# Patient Record
Sex: Male | Born: 1955 | Race: White | Hispanic: No | Marital: Single | Smoking: Former smoker
Health system: Southern US, Community
[De-identification: ages and names within clinical notes are randomized; demographics above are authoritative.]

## PROBLEM LIST (undated history)

## (undated) DIAGNOSIS — I10 Essential (primary) hypertension: Secondary | ICD-10-CM

## (undated) DIAGNOSIS — F32A Depression, unspecified: Secondary | ICD-10-CM

## (undated) DIAGNOSIS — F329 Major depressive disorder, single episode, unspecified: Secondary | ICD-10-CM

## (undated) DIAGNOSIS — K222 Esophageal obstruction: Secondary | ICD-10-CM

## (undated) DIAGNOSIS — K219 Gastro-esophageal reflux disease without esophagitis: Secondary | ICD-10-CM

## (undated) DIAGNOSIS — K449 Diaphragmatic hernia without obstruction or gangrene: Secondary | ICD-10-CM

## (undated) HISTORY — DX: Major depressive disorder, single episode, unspecified: F32.9

## (undated) HISTORY — DX: Gastro-esophageal reflux disease without esophagitis: K21.9

## (undated) HISTORY — PX: OTHER SURGICAL HISTORY: SHX169

## (undated) HISTORY — DX: Depression, unspecified: F32.A

---

## 2012-06-26 ENCOUNTER — Encounter (HOSPITAL_COMMUNITY): Payer: Self-pay | Admitting: Emergency Medicine

## 2012-06-26 ENCOUNTER — Emergency Department (HOSPITAL_COMMUNITY): Payer: 59

## 2012-06-26 ENCOUNTER — Emergency Department (HOSPITAL_COMMUNITY)
Admission: EM | Admit: 2012-06-26 | Discharge: 2012-06-28 | Disposition: A | Discharge status: Home / Self Care | Payer: 59 | Attending: Emergency Medicine | Admitting: Emergency Medicine

## 2012-06-26 DIAGNOSIS — Z79899 Other long term (current) drug therapy: Secondary | ICD-10-CM | POA: Insufficient documentation

## 2012-06-26 DIAGNOSIS — R29818 Other symptoms and signs involving the nervous system: Secondary | ICD-10-CM | POA: Insufficient documentation

## 2012-06-26 DIAGNOSIS — R4182 Altered mental status, unspecified: Secondary | ICD-10-CM | POA: Insufficient documentation

## 2012-06-26 DIAGNOSIS — F061 Catatonic disorder due to known physiological condition: Secondary | ICD-10-CM

## 2012-06-26 DIAGNOSIS — F3289 Other specified depressive episodes: Secondary | ICD-10-CM | POA: Insufficient documentation

## 2012-06-26 DIAGNOSIS — F329 Major depressive disorder, single episode, unspecified: Secondary | ICD-10-CM | POA: Insufficient documentation

## 2012-06-26 HISTORY — DX: Diaphragmatic hernia without obstruction or gangrene: K44.9

## 2012-06-26 HISTORY — DX: Esophageal obstruction: K22.2

## 2012-06-26 LAB — URINALYSIS, ROUTINE W REFLEX MICROSCOPIC
Bilirubin Urine: NEGATIVE
Glucose, UA: NEGATIVE mg/dL
Hgb urine dipstick: NEGATIVE
Ketones, ur: NEGATIVE mg/dL
Leukocytes, UA: NEGATIVE
Nitrite: NEGATIVE
Protein, ur: NEGATIVE mg/dL
Specific Gravity, Urine: 1.013 (ref 1.005–1.030)
Urobilinogen, UA: 1 mg/dL (ref 0.0–1.0)
pH: 7 (ref 5.0–8.0)

## 2012-06-26 LAB — BASIC METABOLIC PANEL
BUN: 6 mg/dL (ref 6–23)
CO2: 29 mEq/L (ref 19–32)
Calcium: 9.3 mg/dL (ref 8.4–10.5)
Chloride: 99 mEq/L (ref 96–112)
Creatinine, Ser: 0.53 mg/dL (ref 0.50–1.35)
GFR calc Af Amer: >90 mL/min (ref >90)
GFR calc non Af Amer: >90 mL/min (ref >90)
Glucose, Bld: 123 mg/dL — ABNORMAL HIGH (ref 70–99)
Potassium: 3.5 mEq/L (ref 3.5–5.1)
Sodium: 136 mEq/L (ref 135–145)

## 2012-06-26 LAB — RAPID URINE DRUG SCREEN, HOSP PERFORMED
Amphetamines: NOT DETECTED
Barbiturates: NOT DETECTED
Benzodiazepines: NOT DETECTED
Cocaine: NOT DETECTED
Opiates: NOT DETECTED
Tetrahydrocannabinol: NOT DETECTED

## 2012-06-26 LAB — CBC
HCT: 39.6 % (ref 39.0–52.0)
Hemoglobin: 14.1 g/dL (ref 13.0–17.0)
MCH: 31.7 pg (ref 26.0–34.0)
MCHC: 35.6 g/dL (ref 30.0–36.0)
MCV: 89 fL (ref 78.0–100.0)
Platelets: 206 10*3/uL (ref 150–400)
RBC: 4.45 MIL/uL (ref 4.22–5.81)
RDW: 12.1 % (ref 11.5–15.5)
WBC: 7.5 10*3/uL (ref 4.0–10.5)

## 2012-06-26 LAB — MAGNESIUM: Magnesium: 1.9 mg/dL (ref 1.5–2.5)

## 2012-06-26 LAB — ETHANOL: Alcohol, Ethyl (B): <11 mg/dL (ref 0–11)

## 2012-06-26 NOTE — ED Notes (Signed)
Family at bedside. Awaiting tele-psych consulting MD to call back. Will remain at bedside until evaluation is complete.

## 2012-06-26 NOTE — ED Notes (Signed)
MD at bedside. 

## 2012-06-26 NOTE — ED Provider Notes (Signed)
History    56 year old male brought in by daughter and sister because of decreased mental status and change in behavior. Patient was just discharged from [facility] today for the same. He initially presented there on October 3 for change in his behavior. Patient seems confused and slow to respond to questioning. Voice is extremely soft and most of his speech is difficult to understand because of this. Family first noticed these changes a week from this past Friday. In the emergency room there he had a CT scan which should a question of a frontal stroke and the patient was admitted. Subsequent MRI was performed did not show any acute abnormalities. Further evaluation including an echocardiogram and carotid Dopplers were unremarkable. It does not appear that he was evaluated by neurology or psychiatry. Per his family patient has not significantly improved which is why they brought him to the emergency room again today. No diagnosed psych hx that they are aware of. No drug or etoh abuse that they know of. Despite his behavior pt has been able to take care of ADLs. Pt with no significant PMHx aside from hiatal hernia, but per family does not regularly go to doctor. 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  CSN: [id number]  Arrival date & time 06/26/12  1903   First MD Initiated Contact with Patient 06/26/12 1927      Chief Complaint  Patient presents with  . Altered Mental Status  . Aphasia    (Consider location/radiation/quality/duration/timing/severity/associated sxs/prior treatment) HPI  Past Medical History  Diagnosis Date  . Esophageal stricture   . Hiatal hernia     History reviewed. No pertinent past surgical history.  No family history on file.  History  Substance Use Topics  . Smoking status: Former Smoker    Quit date: 04/26/2012  . Smokeless tobacco: Not on file  . Alcohol Use: No      Review of Systems  Level 5 caveat applies because of  altered mental status.   Allergies  Review of patient's allergies indicates not on file.  Home Medications  No current outpatient prescriptions on file.  BP 136/73  Pulse 86  Temp 99.1 F (37.3 C) (Oral)  Resp 23  SpO2 98%  Physical Exam  Nursing note and vitals reviewed. Constitutional: He appears well-developed and well-nourished. No distress.       Laying in bed. NAD. Reasonably well kept.  HENT:  Head: Normocephalic and atraumatic.  Eyes: Conjunctivae normal are normal. Pupils are equal, round, and reactive to light. Right eye exhibits no discharge. Left eye exhibits no discharge.       No nysgtamus  Neck: Neck supple.       No nuchal rigidity  Cardiovascular: Normal rate, regular rhythm and normal heart sounds.  Exam reveals no gallop and no friction rub.   No murmur heard. Pulmonary/Chest: Effort normal and breath sounds normal. No respiratory distress.  Abdominal: Soft. He exhibits no distension. There is no tenderness.  Musculoskeletal: He exhibits no edema and no tenderness.  Neurological: He is alert.       Speech slow and hesitant. Speaks in a whisper and most of what he says is not loud enough to understand. Follows basic commands. CN2-12 intact. Strength. 5/5 b/l UE. 4/5 b/l LEs. Would not/could not follow commands for finger to nose. Biceps and patellar reflexes present but seem diminished.   Skin: Skin is warm and dry. He is not diaphoretic.       No concerning rash  noted.  Psychiatric:       Flat affect. Almost catatonic.    ED Course  Procedures (including critical care time)  Labs Reviewed  BASIC METABOLIC PANEL - Abnormal; Notable for the following:    Glucose, Bld 123 (*)     All other components within normal limits  CBC  URINALYSIS, ROUTINE W REFLEX MICROSCOPIC  ETHANOL  URINE RAPID DRUG SCREEN (HOSP PERFORMED)  MAGNESIUM   No results found.  EKG:  Rhythm: Normal sinus Rate: 90 Axis: Normal Intervals: Left atrial enlargement ST segments:  Nonspecific ST changes inferiorly. There is some T wave flattening. Comparison: None  1. Catatonic state       MDM  56yM with AMS. Fairly extensive medical w/u just a couple days ago. Question utility of another medical admission at this time. I have a suspicion that this is actually psychogenic in nature. May be related to death in family. Will obtain psychiatric consultation.   12:18 AM Psychiatry recommendations were reviewed. Recommending inpatient admission. Will discuss with ACT.     Raeford Razor, MD 06/27/12 [phone]

## 2012-06-26 NOTE — ED Notes (Signed)
Tele-psych consult suspended temporarily. Awaiting family at bedside to participate in consult.

## 2012-06-26 NOTE — ED Notes (Signed)
Bed:WA19<BR> Expected date:<BR> Expected time:<BR> Means of arrival:<BR> Comments:<BR> Triage 2

## 2012-06-26 NOTE — ED Notes (Signed)
Family reports recent death of patient mother, approx 1-2 weeks ago. Patient became more withdrawn following the loss.

## 2012-06-26 NOTE — ED Notes (Signed)
Sept 27th- c/o being tired, ran out of gas-- daughter met him and said that he wasn't acting right. Speech slurred then, but patient said he was tired. Went home and slept, felt better. This past Thursday went to [facility] ED with slurred speech, got admitted, d/c'd tonight. Family told that he hasn 't had a stroke, upon arrival to ED, nonverbal, but will answer questions with prompting. S[eech clear- quiet,

## 2012-06-26 NOTE — ED Notes (Signed)
TelePsych consult completed 

## 2012-06-27 MED ORDER — ACETAMINOPHEN 325 MG PO TABS: 650.0000 mg | ORAL_TABLET | ORAL | Status: DC | PRN

## 2012-06-27 MED ORDER — ATORVASTATIN CALCIUM 40 MG PO TABS
40.0000 mg | ORAL_TABLET | Freq: Every day | ORAL | Status: DC
  Administered 2012-06-27: 40 mg via ORAL
  Filled 2012-06-27 (×2): qty 1

## 2012-06-27 MED ORDER — ASPIRIN EC 325 MG PO TBEC
325.0000 mg | DELAYED_RELEASE_TABLET | Freq: Every day | ORAL | Status: DC
  Administered 2012-06-27 – 2012-06-28 (×2): 325 mg via ORAL
  Filled 2012-06-27 (×2): qty 1

## 2012-06-27 MED ORDER — LORAZEPAM 1 MG PO TABS: 1.0000 mg | ORAL_TABLET | Freq: Three times a day (TID) | ORAL | Status: DC | PRN

## 2012-06-27 MED ORDER — ONDANSETRON HCL 4 MG PO TABS: 4.0000 mg | ORAL_TABLET | Freq: Three times a day (TID) | ORAL | Status: DC | PRN

## 2012-06-27 MED ORDER — LISINOPRIL 10 MG PO TABS
10.0000 mg | ORAL_TABLET | Freq: Every day | ORAL | Status: DC
  Administered 2012-06-27: 10 mg via ORAL
  Filled 2012-06-27 (×2): qty 1

## 2012-06-27 MED ORDER — ZOLPIDEM TARTRATE 5 MG PO TABS: 5.0000 mg | ORAL_TABLET | Freq: Every evening | ORAL | Status: DC | PRN

## 2012-06-27 MED ORDER — IBUPROFEN 200 MG PO TABS: 600.0000 mg | ORAL_TABLET | Freq: Three times a day (TID) | ORAL | Status: DC | PRN

## 2012-06-27 NOTE — ED Notes (Signed)
Pt given something to drink.  °

## 2012-06-27 NOTE — Progress Notes (Signed)
Pt confirms he does not have a pcp Provided him a list of [place] self pay providers along with medication and financial assistance resources

## 2012-06-27 NOTE — ED Notes (Signed)
Pt sitting quietly in bed. NAD noted at this time. Pt assisted with the urinal.

## 2012-06-27 NOTE — Progress Notes (Signed)
CSW received call from pt RN, regarding placement for patient. Per chart review, and discussion with ACT, pt is on the ACT consult list to be assessed. CSW will updated pt RN. Please reconsult csw after ACT has assessed patient.   Catha Gosselin, LCSWA  [phone] 06/27/2012 1816pm

## 2012-06-27 NOTE — ED Provider Notes (Signed)
No new c/o  Donnetta Hutching, MD 06/27/12 [phone]

## 2012-06-27 NOTE — BH Assessment (Signed)
Assessment Note   Mark Ho is an 56 y.o. male who was brought to [facility] by his sister and daughter who are present for assessment. Pt was d/c from another ED earlier today and family says that pt needs further diagnostic testing. Pt appears confused and barely speaks. He holds cup of coffee in one hand but doesn't drink it or move arm. Catatonic-like symptoms present. Pt oriented to self and date. When asked how [place] president is, he mumbles incoherently and then says, "Fraser Din". Pt is quiet remainder of assessment and doesn't speak.  Sister Britta Mccreedy provides collateral info. She says pt was "acting normal" until 2 wks ago when he ran out of gas. She says he didn't know where to pour gas from gas can until daughter showed him. Britta Mccreedy sts that pt has seemed scared and confused for past 2 weeks. He barely speaks and stares at the wall or TV most of the time. Pt worked as a Tour manager until recently. Pt has no hx of mental illness and never had MH treatment.  Telepsych initiated by EDP rec inpatient hospitalization. Dr Jacky Kindle of telepsych consult sts, "Based upon present presentation, I would repeat medical and neurological workups including a spinal tap. Once medically/neurologically cleared pt can be transferred to a psych facility".  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 Esophageal stricture   . Hiatal hernia    Axis IV: Deferred Axis V: 21-30 behavior considerably influenced by delusions or hallucinations OR serious impairment in judgment, communication OR inability to function in almost all areas  Past Medical History:  Past Medical History  Diagnosis Date  . Esophageal stricture   . Hiatal hernia     History reviewed. No pertinent past surgical history.  Family History: No family history on file.  Social History:  reports that he quit smoking about 2 months ago. He does not have any smokeless tobacco history on  file. He reports that he does not drink alcohol or use illicit drugs.  Additional Social History:  Alcohol / Drug Use Pain Medications: see PTA meds list Prescriptions: see PTA meds list Over the Counter: see PTA meds list History of alcohol / drug use?: No history of alcohol / drug abuse Longest period of sobriety (when/how long): na  CIWA: CIWA-Ar BP: 134/78 mmHg Pulse Rate: 64  COWS:    Allergies: Not on File  Home Medications:  (Not in a hospital admission)  OB/GYN Status:  No LMP for male patient.  General Assessment Data Location of Assessment: [facility] ED Living Arrangements: Spouse/significant other Can pt return to current living arrangement?: Yes Admission Status: Voluntary Is patient capable of signing voluntary admission?: No Transfer from: Home Referral Source: Self/Family/Friend  Education Status Is patient currently in school?: No Current Grade: na Highest grade of school patient has completed: 8  Risk to self Suicidal Ideation:  (unable to assess) Suicidal Intent:  (unable to assess) Is patient at risk for suicide?: No Suicidal Plan?:  (unable to assess) Access to Means:  (unable to assess) What has been your use of drugs/alcohol within the last 12 months?:  (unable to assess - UDS and BAL are negative) Previous Attempts/Gestures:  (unable to assess) How many times?:  (unable to assess) Other Self Harm Risks:  (unable to assess) Triggers for Past Attempts:  (na) Intentional Self Injurious Behavior: None Family Suicide History: No Recent stressful life event(s):  (unable to assess) Persecutory voices/beliefs?:  (unable to  assess) Depression:  (unable to assess) Depression Symptoms:  (unable to assess) Substance abuse history and/or treatment for substance abuse?:  (unable to assess) Suicide prevention information given to non-admitted patients: Not applicable  Risk to Others Homicidal Ideation:  (unable to assess) Thoughts of Harm to Others:  (unable  to assess) Current Homicidal Intent:  (unable to assess) Current Homicidal Plan:  (unable to assess) Access to Homicidal Means:  (unable to assess) Identified Victim:  (unable to assess) History of harm to others?:  (unable to assess) Assessment of Violence: None Noted Violent Behavior Description:  (unable to assess) Does patient have access to weapons?:  (unable to assess) Criminal Charges Pending?:  (sister sts no charges pending) Does patient have a court date:  (sister sts pt has no court date)  Psychosis Hallucinations:  (unable to assess) Delusions:  (unable to assess)  Mental Status Report Eye Contact: Poor Motor Activity: Rigidity;Psychomotor retardation Speech: Soft;Incoherent;Other (Comment) (mumbling) Level of Consciousness: Quiet/awake Mood:  (unable to assess) Affect: Other (Comment) (confused) Anxiety Level:  (unable to assess) Thought Processes:  (unable to assess as pt barely speaking) Judgement:  (unable to assess) Orientation: Person;Time Obsessive Compulsive Thoughts/Behaviors:  (unable to assess)  Cognitive Functioning Concentration:  (unable to assess) Memory:  (unable to assess) IQ:  (unable to assess) Insight:  (unable to assess) Impulse Control:  (unable to assess) Appetite:  (unable to assess) Weight Loss: 0  Weight Gain: 0   ADLScreening [facility]) Patient's cognitive ability adequate to safely complete daily activities?: No Patient able to express need for assistance with ADLs?: No Independently performs ADLs?: Yes (appropriate for developmental age)  Abuse/Neglect [facility]) Physical Abuse:  (unable to assess) Verbal Abuse:  (unable to assess) Sexual Abuse:  (unable to assess)  Prior Inpatient Therapy Prior Inpatient Therapy: No (sister sts no hx of inpt therapy) Prior Therapy Dates: na Prior Therapy Facilty/Provider(s): na Reason for Treatment: na  Prior Outpatient Therapy Prior Outpatient Therapy: No (sister sts pt has  no hx of outpt tx) Prior Therapy Dates: na Prior Therapy Facilty/Provider(s): na Reason for Treatment: na  ADL Screening (condition at time of admission) Patient's cognitive ability adequate to safely complete daily activities?: No Patient able to express need for assistance with ADLs?: No Independently performs ADLs?: Yes (appropriate for developmental age) Weakness of Legs: None Weakness of Arms/Hands: None  Home Assistive Devices/Equipment Home Assistive Devices/Equipment: None    Abuse/Neglect Assessment (Assessment to be complete while patient is alone) Physical Abuse:  (unable to assess) Verbal Abuse:  (unable to assess) Sexual Abuse:  (unable to assess) Exploitation of patient/patient's resources:  (unable to assess) Values / Beliefs Cultural Requests During Hospitalization: None Spiritual Requests During Hospitalization: None   Advance Directives (For Healthcare) Advance Directive: Patient does not have advance directive;Patient would not like information    Additional Information 1:1 In Past 12 Months?: No CIRT Risk: No Elopement Risk: No Does patient have medical clearance?: Yes     Disposition:  Disposition Disposition of Patient: Inpatient treatment program  On Site Evaluation by:   Reviewed with Physician:     Donnamarie Rossetti P 06/27/2012 3:04 AM

## 2012-06-27 NOTE — ED Notes (Signed)
Pt given coffee. °

## 2012-06-27 NOTE — ED Notes (Signed)
Pt was able to eat a small amount of his breakfast this morning. Pt is verbal, speech is soft and mumbled. NAD at this time.

## 2012-06-28 ENCOUNTER — Emergency Department (HOSPITAL_COMMUNITY): Payer: 59

## 2012-06-28 DIAGNOSIS — F39 Unspecified mood [affective] disorder: Secondary | ICD-10-CM

## 2012-06-28 LAB — CBC WITH DIFFERENTIAL/PLATELET
Basophils Absolute: 0 10*3/uL (ref 0.0–0.1)
Basophils Relative: 0 % (ref 0–1)
Eosinophils Absolute: 0.1 10*3/uL (ref 0.0–0.7)
MCH: 32.2 pg (ref 26.0–34.0)
MCHC: 35.9 g/dL (ref 30.0–36.0)
Neutrophils Relative %: 62 % (ref 43–77)
Platelets: 199 10*3/uL (ref 150–400)
RDW: 12.3 % (ref 11.5–15.5)

## 2012-06-28 LAB — POCT I-STAT, CHEM 8
BUN: 19 mg/dL (ref 6–23)
Calcium, Ion: 1.22 mmol/L (ref 1.12–1.23)
Chloride: 96 mEq/L (ref 96–112)
Creatinine, Ser: 0.8 mg/dL (ref 0.50–1.35)
Glucose, Bld: 104 mg/dL — ABNORMAL HIGH (ref 70–99)
HCT: 43 % (ref 39.0–52.0)
Hemoglobin: 14.6 g/dL (ref 13.0–17.0)
Potassium: 3.7 mEq/L (ref 3.5–5.1)
Sodium: 134 mEq/L — ABNORMAL LOW (ref 135–145)
TCO2: 26 mmol/L (ref 0–100)

## 2012-06-28 MED ORDER — ESCITALOPRAM OXALATE 10 MG PO TABS: 10.0000 mg | ORAL_TABLET | Freq: Every day | ORAL | Status: DC

## 2012-06-28 MED ORDER — CLONAZEPAM 0.5 MG PO TABS: 0.2500 mg | ORAL_TABLET | Freq: Two times a day (BID) | ORAL | Status: DC | PRN

## 2012-06-28 NOTE — ED Notes (Signed)
Pt dressed in his regular clothes, pt is being discharge.

## 2012-06-28 NOTE — ED Provider Notes (Addendum)
History     CSN: [id number]  Arrival date & time 06/26/12  1903   First MD Initiated Contact with Patient 06/26/12 1927      Chief Complaint  Patient presents with  . Altered Mental Status  . Aphasia    (Consider location/radiation/quality/duration/timing/severity/associated sxs/prior treatment) HPI  Past Medical History  Diagnosis Date  . Esophageal stricture   . Hiatal hernia     History reviewed. No pertinent past surgical history.  No family history on file.  History  Substance Use Topics  . Smoking status: Former Smoker    Quit date: 04/26/2012  . Smokeless tobacco: Not on file  . Alcohol Use: No      Review of Systems  Allergies  Review of patient's allergies indicates not on file.  Home Medications   Current Outpatient Rx  Name Route Sig Dispense Refill  . ASPIRIN 325 MG PO TBEC Oral Take 325 mg by mouth daily.    . ATORVASTATIN CALCIUM 40 MG PO TABS Oral Take 40 mg by mouth daily.    Marland Kitchen LISINOPRIL 10 MG PO TABS Oral Take 10 mg by mouth daily.      BP 90/56  Pulse 74  Temp 97.6 F (36.4 C) (Oral)  Resp 16  Ht 5\' 8"  (1.727 m)  Wt 127 lb (57.607 kg)  BMI 19.31 kg/m2  SpO2 99%  Physical Exam  ED Course  Procedures (including critical care time)  Labs Reviewed  BASIC METABOLIC PANEL - Abnormal; Notable for the following:    Glucose, Bld 123 (*)     All other components within normal limits  CBC  URINALYSIS, ROUTINE W REFLEX MICROSCOPIC  ETHANOL  URINE RAPID DRUG SCREEN (HOSP PERFORMED)  MAGNESIUM  CBC WITH DIFFERENTIAL   Ct Head Wo Contrast  06/26/2012  *RADIOLOGY REPORT*  Clinical Data: Altered mental status.  CT HEAD WITHOUT CONTRAST  Technique:  Contiguous axial images were obtained from the base of the skull through the vertex without contrast.  Comparison: MRI 06/24/2012  Findings: No acute intracranial abnormality.  Specifically, no hemorrhage, hydrocephalus, mass lesion, acute infarction, or significant intracranial injury.  No  acute calvarial abnormality. Visualized paranasal sinuses and mastoids clear.  Orbital soft tissues unremarkable.  IMPRESSION: No acute intracranial abnormality.   Original Report Authenticated By: Cyndie Chime, M.D.      1. Catatonic state       MDM   Patient was initially seen for altered mental status which proved nursing note the family states that he had slurred speech. Patient recently had a hospitalization with a complete stroke workup at [facility] that was all negative including an MRI showing no acute abnormalities.  Patient's complaint daughter was that he was tired and she felt that he was not acting right. Initially upon arrival to the emergency room patient was nonverbal and would only answer questions with prompting however at that time his speech is clear but quiet. Telepsych saw the patient yesterday and thought that he could benefit from psychiatric hospitalization however first they felt he needed a repeat medical or neurologic exam with a possible LP. Reason I feel the patient would need a lumbar puncture would be to rule out meningitis.  Patient displays no signs of meningitis on exam. He is able to completely range his neck and denies neck or head pain. He is able to answer questions appropriately on exam with no slurred speech or signs of altered mental status at this time.  Will repeat CBC and i-stat and  CXR to ensure no new issues.  However feel he is medically stable.    Date: 06/28/2012  Rate: 83  Rhythm: normal sinus rhythm  QRS Axis: normal  Intervals: normal  ST/T Wave abnormalities: normal  Conduction Disutrbances: none  Narrative Interpretation: unremarkable   2:45 PM Pt seen by Dr. Shela Commons and recommends outpt f/u as well as klonipin 0.25mg  bid prn and lexapro 10mg .       Gwyneth Sprout, MD 06/28/12 1019  Gwyneth Sprout, MD 06/28/12 1445

## 2012-06-28 NOTE — ED Notes (Signed)
Dr. Anitra Lauth aware of BP 90/56, instruct to continue the oral hydration and monitoring. Pt given water. Lisinopril held.

## 2012-06-28 NOTE — ED Notes (Signed)
Family members called but no answer, Message has been left. Pt need a ride to go home.

## 2012-06-28 NOTE — ED Notes (Signed)
Dr. Anitra Lauth has been informed of pt's low BP and is okay for this patient to be discharge.

## 2012-06-28 NOTE — ED Notes (Signed)
Family called to check in on pt. Pt stated it was ok for me to talk to his family about his care. Family updated on pt care. Family stated that they would be there to see pt about 0900.

## 2012-06-28 NOTE — ED Notes (Signed)
Oral hydration given

## 2012-06-28 NOTE — Clinical Social Work Note (Signed)
CSW f/u with [facility] re: pt referral for inpatient tx.  Intake stated had not received pt information.  CSW re-faxed referral/information to [facility] for possible inpatient tx for pt.  CSW will continue to assist. Vickii Penna, LCSWA [phone]  Clinical Social Work

## 2012-06-28 NOTE — ED Notes (Signed)
Daughter on phone, Daughter has all of patient's belongings. Daughter will be coming to pick patient up, time may be 2 hours. Pt placed in hallway, pending for ride at this moment. Discharge instruction given.

## 2012-06-28 NOTE — ED Notes (Signed)
Daughter is here to pick patient up

## 2012-06-28 NOTE — ED Notes (Signed)
The psychiatrist thinks the pt can go home

## 2012-06-28 NOTE — Consult Note (Signed)
Reason for Consult: Altered mental status, partial catatonia, depression, and anxiety Referring Physician: Dr. Elsie Stain Mark Ho is an 56 y.o. male.  HPI: Patient was seen and chart reviewed. Patient was suffering with unknown,  depression, anxiety and worry. Patient was presented to the [facility] long emergency department with the altered mental status. Partial catatonia, and confusion. Patient has improved his mental status since admission to the emergency department. Patient has a generalized weakness and reportedly, on and off, according to his family. Patient sister is supportive to him and willing to keep him up at home and take care of him, his the nutritional support and seek out off his medical care if needed.  Patient was able to rest on verbally move all his extremities. Eat his food sig himself able to step stand up from the bed and walk back and forth from the blow to the bed. Patient stated that she felt somewhat dizzy when he is turning around. Patient was observed watching television without difficulty. Patient denied suicidal or homicidal ideation, intentions, or plans. Patient has no previous history of for mental health problems. Patient the recent workup for this stroke medical examination evaluation were within normal limits.  Past Medical History  Diagnosis Date  . Esophageal stricture   . Hiatal hernia     History reviewed. No pertinent past surgical history.  No family history on file.  Social History:  reports that he quit smoking about 2 months ago. He does not have any smokeless tobacco history on file. He reports that he does not drink alcohol or use illicit drugs.  Allergies: No Known Allergies  Medications: I have reviewed the patient's current medications.  Results for orders placed during the hospital encounter of 06/26/12 (from the past 48 hour(s))  CBC     Status: Normal   Collection Time   06/26/12  7:51 PM      Component Value Range Comment   WBC 7.5   4.0 - 10.5 K/uL    RBC 4.45  4.22 - 5.81 MIL/uL    Hemoglobin 14.1  13.0 - 17.0 g/dL    HCT 16.1  09.6 - 04.5 %    MCV 89.0  78.0 - 100.0 fL    MCH 31.7  26.0 - 34.0 pg    MCHC 35.6  30.0 - 36.0 g/dL    RDW 40.9  81.1 - 91.4 %    Platelets 206  150 - 400 K/uL   BASIC METABOLIC PANEL     Status: Abnormal   Collection Time   06/26/12  7:51 PM      Component Value Range Comment   Sodium 136  135 - 145 mEq/L    Potassium 3.5  3.5 - 5.1 mEq/L    Chloride 99  96 - 112 mEq/L    CO2 29  19 - 32 mEq/L    Glucose, Bld 123 (*) 70 - 99 mg/dL    BUN 6  6 - 23 mg/dL    Creatinine, Ser 7.82  0.50 - 1.35 mg/dL    Calcium 9.3  8.4 - 95.6 mg/dL    GFR calc non Af Amer >90  >90 mL/min    GFR calc Af Amer >90  >90 mL/min   ETHANOL     Status: Normal   Collection Time   06/26/12  7:51 PM      Component Value Range Comment   Alcohol, Ethyl (B) <11  0 - 11 mg/dL   MAGNESIUM  Status: Normal   Collection Time   06/26/12  7:51 PM      Component Value Range Comment   Magnesium 1.9  1.5 - 2.5 mg/dL   URINALYSIS, ROUTINE W REFLEX MICROSCOPIC     Status: Normal   Collection Time   06/26/12 10:50 PM      Component Value Range Comment   Color, Urine YELLOW  YELLOW    APPearance CLEAR  CLEAR    Specific Gravity, Urine 1.013  1.005 - 1.030    pH 7.0  5.0 - 8.0    Glucose, UA NEGATIVE  NEGATIVE mg/dL    Hgb urine dipstick NEGATIVE  NEGATIVE    Bilirubin Urine NEGATIVE  NEGATIVE    Ketones, ur NEGATIVE  NEGATIVE mg/dL    Protein, ur NEGATIVE  NEGATIVE mg/dL    Urobilinogen, UA 1.0  0.0 - 1.0 mg/dL    Nitrite NEGATIVE  NEGATIVE    Leukocytes, UA NEGATIVE  NEGATIVE MICROSCOPIC NOT DONE ON URINES WITH NEGATIVE PROTEIN, BLOOD, LEUKOCYTES, NITRITE, OR GLUCOSE <1000 mg/dL.  URINE RAPID DRUG SCREEN (HOSP PERFORMED)     Status: Normal   Collection Time   06/26/12 10:50 PM      Component Value Range Comment   Opiates NONE DETECTED  NONE DETECTED    Cocaine NONE DETECTED  NONE DETECTED    Benzodiazepines NONE  DETECTED  NONE DETECTED    Amphetamines NONE DETECTED  NONE DETECTED    Tetrahydrocannabinol NONE DETECTED  NONE DETECTED    Barbiturates NONE DETECTED  NONE DETECTED   CBC WITH DIFFERENTIAL     Status: Normal   Collection Time   06/28/12 10:29 AM      Component Value Range Comment   WBC 5.2  4.0 - 10.5 K/uL    RBC 4.54  4.22 - 5.81 MIL/uL    Hemoglobin 14.6  13.0 - 17.0 g/dL    HCT 21.3  08.6 - 57.8 %    MCV 89.6  78.0 - 100.0 fL    MCH 32.2  26.0 - 34.0 pg    MCHC 35.9  30.0 - 36.0 g/dL    RDW 46.9  62.9 - 52.8 %    Platelets 199  150 - 400 K/uL    Neutrophils Relative 62  43 - 77 %    Neutro Abs 3.2  1.7 - 7.7 K/uL    Lymphocytes Relative 27  12 - 46 %    Lymphs Abs 1.4  0.7 - 4.0 K/uL    Monocytes Relative 10  3 - 12 %    Monocytes Absolute 0.5  0.1 - 1.0 K/uL    Eosinophils Relative 1  0 - 5 %    Eosinophils Absolute 0.1  0.0 - 0.7 K/uL    Basophils Relative 0  0 - 1 %    Basophils Absolute 0.0  0.0 - 0.1 K/uL   POCT I-STAT, CHEM 8     Status: Abnormal   Collection Time   06/28/12 10:44 AM      Component Value Range Comment   Sodium 134 (*) 135 - 145 mEq/L    Potassium 3.7  3.5 - 5.1 mEq/L    Chloride 96  96 - 112 mEq/L    BUN 19  6 - 23 mg/dL    Creatinine, Ser 4.13  0.50 - 1.35 mg/dL    Glucose, Bld 244 (*) 70 - 99 mg/dL    Calcium, Ion 0.10  2.72 - 1.23 mmol/L    TCO2 26  0 - 100 mmol/L    Hemoglobin 14.6  13.0 - 17.0 g/dL    HCT 62.9  52.8 - 41.3 %     Ct Head Wo Contrast  06/26/2012  *RADIOLOGY REPORT*  Clinical Data: Altered mental status.  CT HEAD WITHOUT CONTRAST  Technique:  Contiguous axial images were obtained from the base of the skull through the vertex without contrast.  Comparison: MRI 06/24/2012  Findings: No acute intracranial abnormality.  Specifically, no hemorrhage, hydrocephalus, mass lesion, acute infarction, or significant intracranial injury.  No acute calvarial abnormality. Visualized paranasal sinuses and mastoids clear.  Orbital soft tissues  unremarkable.  IMPRESSION: No acute intracranial abnormality.   Original Report Authenticated By: Cyndie Chime, M.D.     No psychosis and Positive for anxiety, bad mood, depression and Generalized weakness.  Blood pressure 90/52, pulse 66, temperature 98.4 F (36.9 C), temperature source Oral, resp. rate 14, height 5\' 8"  (1.727 m), weight 127 lb (57.607 kg), SpO2 98.00%.   Assessment/Plan: Mood disorder, not otherwise specified.  Recommended antidepressant medication Lexapro 10 mg daily for depression, and clonazepam 0.25 mg twice daily for anxiety. Patient does not meet criteria for acute psychiatric hospitalization and will be referred to the outpatient psychiatric services.  Mark Ho,Mark R. 06/28/2012, 3:02 PM

## 2012-07-08 ENCOUNTER — Encounter (HOSPITAL_COMMUNITY): Payer: Self-pay

## 2012-07-08 ENCOUNTER — Emergency Department (HOSPITAL_COMMUNITY): Payer: 59

## 2012-07-08 ENCOUNTER — Inpatient Hospital Stay (HOSPITAL_COMMUNITY)
Admission: EM | Admit: 2012-07-08 | Discharge: 2012-07-19 | DRG: 071 | Disposition: A | Discharge status: Home / Self Care | Payer: 59 | Source: Ambulatory Visit | Attending: Internal Medicine | Admitting: Internal Medicine

## 2012-07-08 DIAGNOSIS — I10 Essential (primary) hypertension: Secondary | ICD-10-CM | POA: Diagnosis present

## 2012-07-08 DIAGNOSIS — G9340 Encephalopathy, unspecified: Secondary | ICD-10-CM

## 2012-07-08 DIAGNOSIS — G9349 Other encephalopathy: Principal | ICD-10-CM | POA: Diagnosis present

## 2012-07-08 DIAGNOSIS — F329 Major depressive disorder, single episode, unspecified: Secondary | ICD-10-CM | POA: Diagnosis present

## 2012-07-08 DIAGNOSIS — R4182 Altered mental status, unspecified: Secondary | ICD-10-CM

## 2012-07-08 DIAGNOSIS — R7401 Elevation of levels of liver transaminase levels: Secondary | ICD-10-CM | POA: Diagnosis present

## 2012-07-08 DIAGNOSIS — F061 Catatonic disorder due to known physiological condition: Secondary | ICD-10-CM | POA: Diagnosis present

## 2012-07-08 DIAGNOSIS — R29818 Other symptoms and signs involving the nervous system: Secondary | ICD-10-CM | POA: Diagnosis present

## 2012-07-08 DIAGNOSIS — R7402 Elevation of levels of lactic acid dehydrogenase (LDH): Secondary | ICD-10-CM | POA: Diagnosis present

## 2012-07-08 HISTORY — DX: Essential (primary) hypertension: I10

## 2012-07-08 LAB — CBC WITH DIFFERENTIAL/PLATELET
Basophils Relative: 0 % (ref 0–1)
Hemoglobin: 12.2 g/dL — ABNORMAL LOW (ref 13.0–17.0)
Lymphocytes Relative: 12 % (ref 12–46)
MCHC: 35.2 g/dL (ref 30.0–36.0)
Monocytes Relative: 7 % (ref 3–12)
Neutro Abs: 10.7 10*3/uL — ABNORMAL HIGH (ref 1.7–7.7)
Neutrophils Relative %: 81 % — ABNORMAL HIGH (ref 43–77)
RBC: 3.82 MIL/uL — ABNORMAL LOW (ref 4.22–5.81)
WBC: 13.2 10*3/uL — ABNORMAL HIGH (ref 4.0–10.5)

## 2012-07-08 LAB — RAPID URINE DRUG SCREEN, HOSP PERFORMED
Barbiturates: NOT DETECTED
Benzodiazepines: NOT DETECTED
Cocaine: NOT DETECTED
Tetrahydrocannabinol: NOT DETECTED

## 2012-07-08 LAB — URINALYSIS, ROUTINE W REFLEX MICROSCOPIC
Glucose, UA: NEGATIVE mg/dL
Ketones, ur: NEGATIVE mg/dL
pH: 6 (ref 5.0–8.0)

## 2012-07-08 LAB — COMPREHENSIVE METABOLIC PANEL
AST: 153 U/L — ABNORMAL HIGH (ref 0–37)
Albumin: 3.4 g/dL — ABNORMAL LOW (ref 3.5–5.2)
Alkaline Phosphatase: 110 U/L (ref 39–117)
BUN: 10 mg/dL (ref 6–23)
Chloride: 98 mEq/L (ref 96–112)
Potassium: 3.5 mEq/L (ref 3.5–5.1)
Total Bilirubin: 0.7 mg/dL (ref 0.3–1.2)

## 2012-07-08 LAB — LIPASE, BLOOD: Lipase: 30 U/L (ref 11–59)

## 2012-07-08 MED ORDER — ASPIRIN EC 325 MG PO TBEC
325.0000 mg | DELAYED_RELEASE_TABLET | Freq: Every day | ORAL | Status: DC
  Administered 2012-07-09 – 2012-07-19 (×9): 325 mg via ORAL
  Filled 2012-07-08 (×11): qty 1

## 2012-07-08 MED ORDER — CLONAZEPAM 0.5 MG PO TABS: 0.2500 mg | ORAL_TABLET | Freq: Two times a day (BID) | ORAL | Status: DC | PRN

## 2012-07-08 MED ORDER — HEPARIN SODIUM (PORCINE) 5000 UNIT/ML IJ SOLN
5000.0000 [IU] | Freq: Three times a day (TID) | INTRAMUSCULAR | Status: DC
  Administered 2012-07-09 – 2012-07-19 (×30): 5000 [IU] via SUBCUTANEOUS
  Filled 2012-07-08 (×35): qty 1

## 2012-07-08 MED ORDER — LORAZEPAM 2 MG/ML IJ SOLN
1.0000 mg | Freq: Once | INTRAMUSCULAR | Status: AC
  Administered 2012-07-08: 1 mg via INTRAVENOUS

## 2012-07-08 MED ORDER — PANTOPRAZOLE SODIUM 40 MG PO TBEC
40.0000 mg | DELAYED_RELEASE_TABLET | Freq: Every day | ORAL | Status: DC
  Administered 2012-07-09 – 2012-07-19 (×9): 40 mg via ORAL
  Filled 2012-07-08 (×12): qty 1

## 2012-07-08 MED ORDER — LISINOPRIL 10 MG PO TABS
10.0000 mg | ORAL_TABLET | Freq: Every day | ORAL | Status: DC
  Filled 2012-07-08: qty 1

## 2012-07-08 MED ORDER — LORAZEPAM 2 MG/ML IJ SOLN
1.0000 mg | Freq: Once | INTRAMUSCULAR | Status: AC
  Administered 2012-07-08: 1 mg via INTRAVENOUS
  Filled 2012-07-08: qty 1

## 2012-07-08 MED ORDER — ESCITALOPRAM OXALATE 10 MG PO TABS
10.0000 mg | ORAL_TABLET | Freq: Every day | ORAL | Status: DC
  Administered 2012-07-09 – 2012-07-15 (×5): 10 mg via ORAL
  Filled 2012-07-08 (×9): qty 1

## 2012-07-08 NOTE — ED Notes (Signed)
Pt unable to lay flat for MRI, pt will not rest head flat. MD notified.

## 2012-07-08 NOTE — H&P (Signed)
[facility] History and Physical  Darwin Manzanares ZOX:[id number] DOB: Nov 18, 1955 DOA: 07/08/2012  Referring physician: ED PCP: No primary provider on file.  Specialists: Neuro  Chief Complaint: AMS  HPI: Mark Ho is a 56 y.o. male with depression since his mothers death last 09-19-23 who has been having worsened mental status these past 4 weeks per his family.  He will have episodes lasting as long as all day of blank stare, minimal responsiveness.  Nothing seems to make these worse, when he was put in the MRI tonight ativan provided dramatic improvement (essentially back to baseline for the patient).  Hospitalist service has been asked to admit the patient by neurology service for a DDx of catatonia vs frontal lobe status epilepticus.  Review of Systems: 12 systems reviewed and negative.  Past Medical History  Diagnosis Date  . Esophageal stricture   . Hiatal hernia    History reviewed. No pertinent past surgical history. Social History:  reports that he quit smoking about 2 months ago. He does not have any smokeless tobacco history on file. He reports that he does not drink alcohol or use illicit drugs.   No Known Allergies  History reviewed. No pertinent family history. sister, daughter alive and well and in room.  Prior to Admission medications   Medication Sig Start Date End Date Taking? Authorizing Provider  aspirin 325 MG EC tablet Take 325 mg by mouth daily.   Yes Historical Provider, MD  clonazePAM (KLONOPIN) 0.5 MG tablet Take 0.25 mg by mouth 2 (two) times daily as needed. Anxiety 06/28/12  Yes Gwyneth Sprout, MD  escitalopram (LEXAPRO) 10 MG tablet Take 10 mg by mouth daily. 06/28/12  Yes Gwyneth Sprout, MD  lisinopril (PRINIVIL,ZESTRIL) 10 MG tablet Take 10 mg by mouth daily.   Yes Historical Provider, MD  omeprazole (PRILOSEC) 20 MG capsule Take 20 mg by mouth as needed. Acid reflux   Yes Historical Provider, MD   Physical Exam: Filed Vitals:   07/08/12  1739 07/08/12 2244  BP: 131/79 115/68  Pulse: 70 65  Temp: 97.8 F (36.6 C) 99 F (37.2 C)  TempSrc: Oral Oral  Resp: 18 18  SpO2: 98% 97%    General:  NAD, resting comfortably in bed Eyes: PEERLA EOMI ENT: mucous membranes moist Neck: supple w/o JVD Cardiovascular: RRR w/o MRG Respiratory: CTA B Abdomen: soft, nt, nd, bs+ Skin: no rash nor lesion Musculoskeletal: MAE, full ROM all 4 extremities Psychiatric: flat tone and affect Neurologic: AAOx3, grossly non-focal  Labs on Admission:  Basic Metabolic Panel:  Lab 07/08/12 8119  NA 134*  K 3.5  CL 98  CO2 28  GLUCOSE 96  BUN 10  CREATININE 0.53  CALCIUM 9.2  MG --  PHOS --   Liver Function Tests:  Lab 07/08/12 2000  AST 153*  ALT 272*  ALKPHOS 110  BILITOT 0.7  PROT 6.5  ALBUMIN 3.4*    Lab 07/08/12 2000  LIPASE 30  AMYLASE --   No results found for this basename: AMMONIA:5 in the last 168 hours CBC:  Lab 07/08/12 2000  WBC 13.2*  NEUTROABS 10.7*  HGB 12.2*  HCT 34.7*  MCV 90.8  PLT 211   Cardiac Enzymes: No results found for this basename: CKTOTAL:5,CKMB:5,CKMBINDEX:5,TROPONINI:5 in the last 168 hours  BNP (last 3 results) No results found for this basename: PROBNP:3 in the last 8760 hours CBG: No results found for this basename: GLUCAP:5 in the last 168 hours  Radiological Exams on Admission: Dg Chest 2 View  07/08/2012  *RADIOLOGY REPORT*  Clinical Data: Cough and wheezing  CHEST - 2 VIEW  Comparison: 06/28/2012  Findings: Heart size is normal.  No pleural effusion or edema.  No airspace consolidation identified.  The visualized osseous structures are unremarkable.  IMPRESSION:  1.  No acute cardiopulmonary abnormalities.   Original Report Authenticated By: Rosealee Albee, M.D.    Mr Brain Wo Contrast  07/08/2012  *RADIOLOGY REPORT*  Clinical Data: History of chronic alcoholism presenting with altered mental status for 3 weeks.  Cough.  Noncooperative and combative.  MRI HEAD WITHOUT  CONTRAST  Technique:  Multiplanar, multiecho pulse sequences of the brain and surrounding structures were obtained according to standard protocol without intravenous contrast.  Comparison: CT head 06/26/2012  Findings: The patient is extremely combative and uncooperative during scanning despite administration of that of an.  Despite multiple attempts, only axial diffusion weighted and ADC images were obtained.  The patient was not able to tolerate additional imaging and no additional images were obtained.  There is no evidence of any focal restricted diffusion to suggest acute infarct.  Visualized ventricles and sulci appear symmetrical without mass effect or midline shift.  Small focal extra-axial fluid collection is noted in the anterior middle cranial fossa consistent with a small arachnoid cyst.  IMPRESSION: Technically limited study due to uncooperative patient.  Only diffusion weighted images obtained.  No evidence of restricted diffusion to suggest acute infarct.   Original Report Authenticated By: Marlon Pel, M.D.     EKG: Independently reviewed.  Assessment/Plan Active Problems:  Depression  Catatonia   1. 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 2. Depression - secondary to mothers death, possibly causing catatonia, very flat affect, patient obviously severely depressed.  Code Status: Full Code Family Communication: Spoke with daughter and sister at bedside Disposition Plan: Admit to obs  Time spent: 50 min  Amita Atayde M. [facility] Pager [phone]  If 7PM-7AM, please contact night-coverage [email or web address] Password [email or web address] 07/08/2012, 11:04 PM

## 2012-07-08 NOTE — ED Provider Notes (Signed)
History     CSN: [id number]  Arrival date & time 07/08/12  1732   First MD Initiated Contact with Patient 07/08/12 1908      Chief Complaint  Patient presents with  . Weakness    (Consider location/radiation/quality/duration/timing/severity/associated sxs/prior treatment) HPI Pt has had 5-6 weeks of mental status change. Seen at [facility] with negative stroke w/u including MRI. Seen in this ED and diagnosed with depression and started on SSRI. Daughter states pt symptoms wax and wane and seem to be worse at night. Pt is confused with decreased speaking and generalized weakness. Has had more difficulty walking and dressing himself. Pt is unable to contribute to history. Daughter also states increased coughing and vomiting multiple times today. No fever or chills. Has outpt psych appointment the end of this month Past Medical History  Diagnosis Date  . Esophageal stricture   . Hiatal hernia     History reviewed. No pertinent past surgical history.  History reviewed. No pertinent family history.  History  Substance Use Topics  . Smoking status: Former Smoker    Quit date: 04/26/2012  . Smokeless tobacco: Not on file  . Alcohol Use: No      Review of Systems  Unable to perform ROS: Mental status change    Allergies  Review of patient's allergies indicates no known allergies.  Home Medications   Current Outpatient Rx  Name Route Sig Dispense Refill  . ASPIRIN 325 MG PO TBEC Oral Take 325 mg by mouth daily.    Marland Kitchen CLONAZEPAM 0.5 MG PO TABS Oral Take 0.25 mg by mouth 2 (two) times daily as needed. Anxiety    . ESCITALOPRAM OXALATE 10 MG PO TABS Oral Take 10 mg by mouth daily.    Marland Kitchen LISINOPRIL 10 MG PO TABS Oral Take 10 mg by mouth daily.    Marland Kitchen OMEPRAZOLE 20 MG PO CPDR Oral Take 20 mg by mouth as needed. Acid reflux      BP 115/68  Pulse 65  Temp 99 F (37.2 C) (Oral)  Resp 18  SpO2 97%  Physical Exam  Nursing note and vitals reviewed. Constitutional: He  appears well-developed and well-nourished. No distress.  HENT:  Head: Normocephalic and atraumatic.  Mouth/Throat: Oropharynx is clear and moist.  Eyes: EOM are normal. Pupils are equal, round, and reactive to light.  Neck: Normal range of motion. Neck supple.  Cardiovascular: Normal rate and regular rhythm.   Pulmonary/Chest: Effort normal and breath sounds normal. No respiratory distress. He has no wheezes. He has no rales. He exhibits no tenderness.  Abdominal: Soft. Bowel sounds are normal. He exhibits no distension and no mass. There is no tenderness. There is no rebound and no guarding.  Musculoskeletal: Normal range of motion. He exhibits no edema and no tenderness.  Neurological:       Pt is awake. He speaks in a very low voice and unable to discern what he is saying. He is following some commands. 4/5 motor in all ext. +DTR's.   Skin: Skin is warm and dry. No rash noted. No erythema.    ED Course  Procedures (including critical care time)  Labs Reviewed  CBC WITH DIFFERENTIAL - Abnormal; Notable for the following:    WBC 13.2 (*)     RBC 3.82 (*)     Hemoglobin 12.2 (*)     HCT 34.7 (*)     Neutrophils Relative 81 (*)     Neutro Abs 10.7 (*)     All  other components within normal limits  COMPREHENSIVE METABOLIC PANEL - Abnormal; Notable for the following:    Sodium 134 (*)     Albumin 3.4 (*)     AST 153 (*)     ALT 272 (*)     All other components within normal limits  URINALYSIS, ROUTINE W REFLEX MICROSCOPIC - Abnormal; Notable for the following:    Bilirubin Urine SMALL (*)     All other components within normal limits  LIPASE, BLOOD  ETHANOL  URINE RAPID DRUG SCREEN (HOSP PERFORMED)  AMMONIA  HEPATITIS PANEL, ACUTE   Dg Chest 2 View  07/08/2012  *RADIOLOGY REPORT*  Clinical Data: Cough and wheezing  CHEST - 2 VIEW  Comparison: 06/28/2012  Findings: Heart size is normal.  No pleural effusion or edema.  No airspace consolidation identified.  The visualized  osseous structures are unremarkable.  IMPRESSION:  1.  No acute cardiopulmonary abnormalities.   Original Report Authenticated By: Rosealee Albee, M.D.    Mr Brain Wo Contrast  07/08/2012  *RADIOLOGY REPORT*  Clinical Data: History of chronic alcoholism presenting with altered mental status for 3 weeks.  Cough.  Noncooperative and combative.  MRI HEAD WITHOUT CONTRAST  Technique:  Multiplanar, multiecho pulse sequences of the brain and surrounding structures were obtained according to standard protocol without intravenous contrast.  Comparison: CT head 06/26/2012  Findings: The patient is extremely combative and uncooperative during scanning despite administration of that of an.  Despite multiple attempts, only axial diffusion weighted and ADC images were obtained.  The patient was not able to tolerate additional imaging and no additional images were obtained.  There is no evidence of any focal restricted diffusion to suggest acute infarct.  Visualized ventricles and sulci appear symmetrical without mass effect or midline shift.  Small focal extra-axial fluid collection is noted in the anterior middle cranial fossa consistent with a small arachnoid cyst.  IMPRESSION: Technically limited study due to uncooperative patient.  Only diffusion weighted images obtained.  No evidence of restricted diffusion to suggest acute infarct.   Original Report Authenticated By: Marlon Pel, M.D.      1. Altered mental status      Date: 07/08/2012  Rate: 72  Rhythm: normal sinus rhythm  QRS Axis: normal  Intervals: normal  ST/T Wave abnormalities: normal  Conduction Disutrbances:none  Narrative Interpretation:   Old EKG Reviewed: unchanged    MDM  Will get old records and consult neurology.  Dr Amada Jupiter recommends MRI w adn w/o contrast and admit for further workup. Suggested delaying LP.   [facility] to admit      Loren Racer, MD 07/08/12 [phone]

## 2012-07-08 NOTE — Consult Note (Signed)
Reason for Consult: Confusion Referring Physician: Loren Racer  CC: Confusion  History is obtained from: Sister, daughter  HPI: Mark Ho is an 56 y.o. male who was in his normal state of health up until a few weeks ago when he began having episodes of confusion. Of note, his mother passed away shortly before that. His daughter reports that with the initial incident a few weeks ago, he ran out of gas, and called her. When she arrived at his truck, he was wandering around it, and seemed confused. She took him to the gas station and he proceeded to walk around the pumps rather than using them. He seemed to have a difficult time figuring out how to use a gas can, but with her help he was able to do it. He repeatedly stated "I'm just tired." Then about 6 days later, his family found him not responding to his girlfriend. He was taken to [place] at that time where workup was reportedly negative (I do not have those records). Following discharge from [place], he went home of his sister who states that he slowly improved up until he was completely back to his normal self over the course of about a week. Then last night, he returned to find him staying in a corner and saying that he peed on himself.  He was persistently confused all day today, and was not walking correctly. His family states that he would not stand up completely and would move very slowly.  Therefore brought him to the emergency room tonight. He was taken for contrasted MRI, and became agitated and was therefore given Ativan. On arrival back to the room, there have been a dramatic improvement in his family states that he was at his baseline.   Of note, at baseline he is cognitively slow. He is unable to read or write and when I asked a question "can you add 7+12," his sister indicates that she doesn't think he could do that normally. He does live with his girlfriend, but takes care for himself. He works as a Hotel manager."   ROS: An 11 point ROS was performed and is negative except as noted in the HPI.  Past Medical History  Diagnosis Date  . Esophageal stricture   . Hiatal hernia     Family History: Mother-Alzheimer's at age 58 Father-died in car accident at age 79, no medical problems at that time  Social History: Tob: Positive, though recently quit He does not drink nor use any drugs of abuse  I examined the patient after Ativan administration, with family stating he is at baseline. Exam: Current vital signs: BP 131/79  Pulse 70  Temp 97.8 F (36.6 C) (Oral)  Resp 18  SpO2 98% Vital signs in last 24 hours: Temp:  [97.8 F (36.6 C)] 97.8 F (36.6 C) (10/18 1739) Pulse Rate:  [70] 70  (10/18 1739) Resp:  [18] 18  (10/18 1739) BP: (131)/(79) 131/79 mmHg (10/18 1739) SpO2:  [98 %] 98 % (10/18 1739)  General: In bed, no apparent distress CV: Regular rate and rhythm Mental Status: Patient is awake, alert, oriented to person, place("hospital"), month, year, and situation. 7+5 = 13, he cannot read nor write at baseline so unable to ask world backwards Patient is able to follow commands and answers questions.  Remembers some events from earlier in the day.  Cranial Nerves: II: Visual Fields are full. Pupils are equal, round, and reactive to light.  Discs are difficult to visulaize. III,IV, VI: EOMI  without ptosis or diploplia. Smooth pursuit is highly saccadic.  V,VII: Facial sensation and movement are symmetric.  VIII: hearing is intact to voice X: Uvula elevates symmetrically XI: Shoulder shrug is symmetric. XII: tongue is midline without atrophy or fasciculations.  Motor: Tone is normal. Bulk is normal. 5/5 strength was present in all four extremities.  Sensory: Sensation is symmetric to light touch in the arms and legs. Deep Tendon Reflexes: 2+ and symmetric in the biceps and patellae.  Cerebellar:  HKS intact bilaterally, mild end-point tremor on FNF  bilaterally Gait: Able to stand, gait is narrow based, but patient appears hesitant to take steps. Romberg negative.   I have reviewed labs in epic and the results pertinent to this consultation are: UDS negative, ethanol negative   I have reviewed the images obtained: MRI brain limited to diffusion-no acute stroke  Impression: 56 year old male with episodes of decreased responsiveness, impaired movements, and urinary incontinence that have been waxing and waning for the past 3 weeks. Response to Ativan would suggest either catatonia absence status as an etiology for these episodes. The description of his movements, the association of his mother's death shortly before starting this, and the character of the waxing and waning nature make me think that catatonia is more likely than an epileptic etiology.   That being said, we need to look for frontal seizure focus with a contrasted MRI, and to repeat an EEG tomorrow morning.   Recommendations: 1) MRI brain with contrast 2) EEG in the morning   Ritta Slot, MD [facility] [phone]  If 7pm- 7am, please page neurology on call at [phone].

## 2012-07-08 NOTE — ED Notes (Addendum)
Pt was recently seen here and dx'd w/depression.  Pt here w/daughter who states he hasn't been very talkative, but even less so today.  States he will have times when he will have a conversation and be able to get around some, then he reverts back to not speaking and is very weak.  She states he has vomited a few times today w/dry heaves as well.  Pt is pink, warm and dry and will follow simple commands but seems to have a difficult time verbalizing.  Pt also has a moist sounding cough.  Pt has an initial appt w/ a  psychiatrist on 07/19/2012

## 2012-07-09 DIAGNOSIS — F329 Major depressive disorder, single episode, unspecified: Secondary | ICD-10-CM

## 2012-07-09 LAB — HEPATIC FUNCTION PANEL
ALT: 214 U/L — ABNORMAL HIGH (ref 0–53)
AST: 92 U/L — ABNORMAL HIGH (ref 0–37)
Indirect Bilirubin: 0.6 mg/dL (ref 0.3–0.9)
Total Protein: 6.1 g/dL (ref 6.0–8.3)

## 2012-07-09 LAB — BASIC METABOLIC PANEL
Chloride: 98 mEq/L (ref 96–112)
Creatinine, Ser: 0.64 mg/dL (ref 0.50–1.35)
GFR calc Af Amer: >90 mL/min (ref >90)
GFR calc non Af Amer: >90 mL/min (ref >90)
Potassium: 4.1 mEq/L (ref 3.5–5.1)

## 2012-07-09 LAB — CBC
Platelets: 202 10*3/uL (ref 150–400)
RDW: 12.8 % (ref 11.5–15.5)
WBC: 12.9 10*3/uL — ABNORMAL HIGH (ref 4.0–10.5)

## 2012-07-09 MED ORDER — LORAZEPAM 2 MG/ML IJ SOLN
1.0000 mg | Freq: Four times a day (QID) | INTRAMUSCULAR | Status: DC | PRN
  Administered 2012-07-10 (×2): 1 mg via INTRAVENOUS
  Filled 2012-07-09 (×2): qty 1

## 2012-07-09 NOTE — Progress Notes (Signed)
[facility] PROGRESS NOTE  [name] AVW:[id number] DOB: 16-Jul-1956 DOA: 07/08/2012 PCP: No primary provider on file.  Assessment/Plan: Active Problems:  Depression  Catatonia     56 year old male with episodes of decreased responsiveness, impaired movements, and urinary incontinence that have been waxing and waning for the past 3 weeks. Response to Ativan would suggest either catatonia absence status as an etiology for these episodes. The description of his movements, the association of his mother's death shortly before starting this, and the character of the waxing and waning nature make me think that catatonia is more likely than an epileptic etiology.  That being said, we need to look for frontal seizure focus with a contrasted MRI, and to repeat an EEG tomorrow morning.  Neurology Recommendations:  1) MRI brain with contrast  2) EEG in the morning  Hypertension Blood pressures off therefore we'll DC lisinopril  Transaminitis Will repeat LFTs    Code Status: full Family Communication: family updated about patient's clinical progress Disposition Plan:  As above    Brief narrative: HPI: Mark Ho is an 57 y.o. male who was in his normal state of health up until a few weeks ago when he began having episodes of confusion. Of note, his mother passed away shortly before that. His daughter reports that with the initial incident a few weeks ago, he ran out of gas, and called her. When she arrived at his truck, he was wandering around it, and seemed confused. She took him to the gas station and he proceeded to walk around the pumps rather than using them. He seemed to have a difficult time figuring out how to use a gas can, but with her help he was able to do it. He repeatedly stated "I'm just tired." Then about 6 days later, his family found him not responding to his girlfriend. He was taken to [facility] at that time where workup was reportedly negative (I do not have those  records). Following discharge from [facility], he went home of his sister who states that he slowly improved up until he was completely back to his normal self over the course of about a week. Then last night, he returned to find him staying in a corner and saying that he peed on himself. He was persistently confused all day today, and was not walking correctly. His family states that he would not stand up completely and would move very slowly.  Therefore brought him to the emergency room tonight. He was taken for contrasted MRI, and became agitated and was therefore given Ativan. On arrival back to the room, there have been a dramatic improvement in his family states that he was at his baseline.  Of note, at baseline he is cognitively slow. He is unable to read or write and when I asked a question "can you add 7+12," his sister indicates that she doesn't think he could do that normally. He does live with his girlfriend, but takes care for himself. He works as a Scientific laboratory technician."   Consultants: Ritta Slot, MD   Procedures:  MRI   Antibiotics:  None  HPI/Subjective: Very flat affect  Objective: Filed Vitals:   07/09/12 0019 07/09/12 0528 07/09/12 0603 07/09/12 0615  BP: 134/76 96/49 107/62 106/61  Pulse: 68 96 89 84  Temp: 99.3 F (37.4 C) 100.2 F (37.9 C)    TempSrc: Oral Oral    Resp: 18 18 18 18   Height:   5\' 8"  (1.727 m)   Weight:   58.106  kg (128 lb 1.6 oz)   SpO2: 98% 95%  96%    Intake/Output Summary (Last 24 hours) at 07/09/12 1136 Last data filed at 07/09/12 0700  Gross per 24 hour  Intake    240 ml  Output      0 ml  Net    240 ml    Exam:  HENT:  Head: Atraumatic.  Nose: Nose normal.  Mouth/Throat: Oropharynx is clear and moist.  Eyes: Conjunctivae are normal. Pupils are equal, round, and reactive to light. No scleral icterus.  Neck: Neck supple. No tracheal deviation present.  Cardiovascular: Normal rate, regular rhythm, normal heart sounds and  intact distal pulses.  Pulmonary/Chest: Effort normal and breath sounds normal. No respiratory distress.  Abdominal: Soft. Normal appearance and bowel sounds are normal. She exhibits no distension. There is no tenderness.  Musculoskeletal: She exhibits no edema and no tenderness.  Neurological: She is alert. No cranial nerve deficit.    Data Reviewed: Basic Metabolic Panel:  Lab 07/09/12 7829 07/08/12 2000  NA 133* 134*  K 4.1 3.5  CL 98 98  CO2 29 28  GLUCOSE 93 96  BUN 9 10  CREATININE 0.64 0.53  CALCIUM 9.2 9.2  MG -- --  PHOS -- --    Liver Function Tests:  Lab 07/08/12 2000  AST 153*  ALT 272*  ALKPHOS 110  BILITOT 0.7  PROT 6.5  ALBUMIN 3.4*    Lab 07/08/12 2000  LIPASE 30  AMYLASE --    Lab 07/08/12 2237  AMMONIA 28    CBC:  Lab 07/09/12 0534 07/08/12 2000  WBC 12.9* 13.2*  NEUTROABS -- 10.7*  HGB 12.0* 12.2*  HCT 34.4* 34.7*  MCV 90.8 90.8  PLT 202 211    Cardiac Enzymes: No results found for this basename: CKTOTAL:5,CKMB:5,CKMBINDEX:5,TROPONINI:5 in the last 168 hours BNP (last 3 results) No results found for this basename: PROBNP:3 in the last 8760 hours   CBG: No results found for this basename: GLUCAP:5 in the last 168 hours  No results found for this or any previous visit (from the past 240 hour(s)).   Studies: Dg Chest 2 View  07/08/2012  *RADIOLOGY REPORT*  Clinical Data: Cough and wheezing  CHEST - 2 VIEW  Comparison: 06/28/2012  Findings: Heart size is normal.  No pleural effusion or edema.  No airspace consolidation identified.  The visualized osseous structures are unremarkable.  IMPRESSION:  1.  No acute cardiopulmonary abnormalities.   Original Report Authenticated By: Rosealee Albee, M.D.    Dg Chest 2 View  07/01/2012  *RADIOLOGY REPORT*  Clinical Data: Cough, smoker  CHEST - 2 VIEW  Comparison: 06/23/2012  Findings: Normal heart size, mediastinal contours, and pulmonary vascularity. Lungs appear minimally hyperexpanded  with mild chronic peribronchial thickening. Probable bilateral nipple shadows, at least one of which is visible on the lateral view. No acute infiltrate, pleural effusion, or pneumothorax. No acute osseous findings.  IMPRESSION: Probable bilateral nipple shadows. Mild chronic bronchitic changes without acute infiltrate.   Original Report Authenticated By: Lollie Marrow, M.D.    Ct Head Wo Contrast  06/26/2012  *RADIOLOGY REPORT*  Clinical Data: Altered mental status.  CT HEAD WITHOUT CONTRAST  Technique:  Contiguous axial images were obtained from the base of the skull through the vertex without contrast.  Comparison: MRI 06/24/2012  Findings: No acute intracranial abnormality.  Specifically, no hemorrhage, hydrocephalus, mass lesion, acute infarction, or significant intracranial injury.  No acute calvarial abnormality. Visualized paranasal sinuses and mastoids  clear.  Orbital soft tissues unremarkable.  IMPRESSION: No acute intracranial abnormality.   Original Report Authenticated By: Cyndie Chime, M.D.    Mr Brain Wo Contrast  07/08/2012  *RADIOLOGY REPORT*  Clinical Data: History of chronic alcoholism presenting with altered mental status for 3 weeks.  Cough.  Noncooperative and combative.  MRI HEAD WITHOUT CONTRAST  Technique:  Multiplanar, multiecho pulse sequences of the brain and surrounding structures were obtained according to standard protocol without intravenous contrast.  Comparison: CT head 06/26/2012  Findings: The patient is extremely combative and uncooperative during scanning despite administration of that of an.  Despite multiple attempts, only axial diffusion weighted and ADC images were obtained.  The patient was not able to tolerate additional imaging and no additional images were obtained.  There is no evidence of any focal restricted diffusion to suggest acute infarct.  Visualized ventricles and sulci appear symmetrical without mass effect or midline shift.  Small focal extra-axial  fluid collection is noted in the anterior middle cranial fossa consistent with a small arachnoid cyst.  IMPRESSION: Technically limited study due to uncooperative patient.  Only diffusion weighted images obtained.  No evidence of restricted diffusion to suggest acute infarct.   Original Report Authenticated By: Marlon Pel, M.D.     Scheduled Meds:   . aspirin  325 mg Oral Daily  . escitalopram  10 mg Oral Daily  . heparin  5,000 Units Subcutaneous Q8H  . lisinopril  10 mg Oral Daily  . LORazepam  1 mg Intravenous Once  . LORazepam  1 mg Intravenous Once  . pantoprazole  40 mg Oral Daily   Continuous Infusions:   Active Problems:  Depression  Catatonia    Time spent: 40 minutes   [facility]  [facility] Pager [phone]. If 8PM-8AM, please contact night-coverage at [email or web address], password [facility] 07/09/2012, 11:36 AM  LOS: 1 day

## 2012-07-09 NOTE — Consult Note (Signed)
Reason for Consult: depression and catatonia Referring Physician: Dr. Esperanza Sheets Ho is an 56 y.o. male.  HPI: Patient was seen and chart reviewed. Patient was admitted to [facility] with the decreased psychomotor activity, urinary incontinence, and waxing and waning of mentation for the last 3 weeks. Patient daughter and granddaughter were at bedside. Patient daughter stated that patient lost his mother December 2012 because of dementia and its complications, while hospitalized in [facility]. Patient has been living with his girlfriend until 2 weeks ago and the the last 2 weeks has been staying with his sister. 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 He was found by family confused and disoriented in near gas station. Patient has been waxing and waning of his psychomotor activity, since than. Patient was observed positively responding to Ativan in the hospital. Patient and the his daughter increase that. Patient has a lot of stresses. Patient has decreased psychomotor activity, soft spoken, verbally responded to querries. Patient endorsed being depressed, sad, unhappy, loss of interest, disturbance of sleep and appetite. He also lost weight about few pounds last 3 weeks. Patient daughter stated that he has a psychiatric appointment scheduled for 29th of this month in [place] for the follow up from the hospital. He has previous history of for psychiatric inpatient hospitalization for depression and anxiety but unable to give the details. Patient denied suicidal ideation, intentions and plan. Has no homicidal ideation, intention, or plan is no evidence of psychotic symptoms. Patient has no history of for alcoholism on his drug of abuse. He has a elevated liver enzymes  Past Medical History  Diagnosis Date  . Esophageal stricture   . Hiatal hernia     History reviewed. No pertinent past surgical history.  History reviewed. No pertinent family  history.  Social History:  reports that he quit smoking about 2 months ago. He does not have any smokeless tobacco history on file. He reports that he does not drink alcohol or use illicit drugs.  Allergies: No Known Allergies  Medications: I have reviewed the patient's current medications.  Results for orders placed during the hospital encounter of 07/08/12 (from the past 48 hour(s))  CBC WITH DIFFERENTIAL     Status: Abnormal   Collection Time   07/08/12  8:00 PM      Component Value Range Comment   WBC 13.2 (*) 4.0 - 10.5 K/uL    RBC 3.82 (*) 4.22 - 5.81 MIL/uL    Hemoglobin 12.2 (*) 13.0 - 17.0 g/dL    HCT 16.1 (*) 09.6 - 52.0 %    MCV 90.8  78.0 - 100.0 fL    MCH 31.9  26.0 - 34.0 pg    MCHC 35.2  30.0 - 36.0 g/dL    RDW 04.5  40.9 - 81.1 %    Platelets 211  150 - 400 K/uL    Neutrophils Relative 81 (*) 43 - 77 %    Neutro Abs 10.7 (*) 1.7 - 7.7 K/uL    Lymphocytes Relative 12  12 - 46 %    Lymphs Abs 1.6  0.7 - 4.0 K/uL    Monocytes Relative 7  3 - 12 %    Monocytes Absolute 0.9  0.1 - 1.0 K/uL    Eosinophils Relative 1  0 - 5 %    Eosinophils Absolute 0.1  0.0 - 0.7 K/uL    Basophils Relative 0  0 - 1 %    Basophils Absolute  0.0  0.0 - 0.1 K/uL   COMPREHENSIVE METABOLIC PANEL     Status: Abnormal   Collection Time   07/08/12  8:00 PM      Component Value Range Comment   Sodium 134 (*) 135 - 145 mEq/L    Potassium 3.5  3.5 - 5.1 mEq/L    Chloride 98  96 - 112 mEq/L    CO2 28  19 - 32 mEq/L    Glucose, Bld 96  70 - 99 mg/dL    BUN 10  6 - 23 mg/dL    Creatinine, Ser 1.61  0.50 - 1.35 mg/dL    Calcium 9.2  8.4 - 09.6 mg/dL    Total Protein 6.5  6.0 - 8.3 g/dL    Albumin 3.4 (*) 3.5 - 5.2 g/dL    AST 045 (*) 0 - 37 U/L    ALT 272 (*) 0 - 53 U/L    Alkaline Phosphatase 110  39 - 117 U/L    Total Bilirubin 0.7  0.3 - 1.2 mg/dL    GFR calc non Af Amer >90  >90 mL/min    GFR calc Af Amer >90  >90 mL/min   LIPASE, BLOOD     Status: Normal   Collection Time    07/08/12  8:00 PM      Component Value Range Comment   Lipase 30  11 - 59 U/L   ETHANOL     Status: Normal   Collection Time   07/08/12  8:00 PM      Component Value Range Comment   Alcohol, Ethyl (B) <11  0 - 11 mg/dL   URINALYSIS, ROUTINE W REFLEX MICROSCOPIC     Status: Abnormal   Collection Time   07/08/12 10:22 PM      Component Value Range Comment   Color, Urine YELLOW  YELLOW    APPearance CLEAR  CLEAR    Specific Gravity, Urine 1.023  1.005 - 1.030    pH 6.0  5.0 - 8.0    Glucose, UA NEGATIVE  NEGATIVE mg/dL    Hgb urine dipstick NEGATIVE  NEGATIVE    Bilirubin Urine SMALL (*) NEGATIVE    Ketones, ur NEGATIVE  NEGATIVE mg/dL    Protein, ur NEGATIVE  NEGATIVE mg/dL    Urobilinogen, UA 1.0  0.0 - 1.0 mg/dL    Nitrite NEGATIVE  NEGATIVE    Leukocytes, UA NEGATIVE  NEGATIVE MICROSCOPIC NOT DONE ON URINES WITH NEGATIVE PROTEIN, BLOOD, LEUKOCYTES, NITRITE, OR GLUCOSE <1000 mg/dL.  URINE RAPID DRUG SCREEN (HOSP PERFORMED)     Status: Normal   Collection Time   07/08/12 10:22 PM      Component Value Range Comment   Opiates NONE DETECTED  NONE DETECTED    Cocaine NONE DETECTED  NONE DETECTED    Benzodiazepines NONE DETECTED  NONE DETECTED    Amphetamines NONE DETECTED  NONE DETECTED    Tetrahydrocannabinol NONE DETECTED  NONE DETECTED    Barbiturates NONE DETECTED  NONE DETECTED   AMMONIA     Status: Normal   Collection Time   07/08/12 10:37 PM      Component Value Range Comment   Ammonia 28  11 - 60 umol/L   CBC     Status: Abnormal   Collection Time   07/09/12  5:34 AM      Component Value Range Comment   WBC 12.9 (*) 4.0 - 10.5 K/uL    RBC 3.79 (*) 4.22 - 5.81 MIL/uL  Hemoglobin 12.0 (*) 13.0 - 17.0 g/dL    HCT 16.1 (*) 09.6 - 52.0 %    MCV 90.8  78.0 - 100.0 fL    MCH 31.7  26.0 - 34.0 pg    MCHC 34.9  30.0 - 36.0 g/dL    RDW 04.5  40.9 - 81.1 %    Platelets 202  150 - 400 K/uL   BASIC METABOLIC PANEL     Status: Abnormal   Collection Time   07/09/12  5:34  AM      Component Value Range Comment   Sodium 133 (*) 135 - 145 mEq/L    Potassium 4.1  3.5 - 5.1 mEq/L    Chloride 98  96 - 112 mEq/L    CO2 29  19 - 32 mEq/L    Glucose, Bld 93  70 - 99 mg/dL    BUN 9  6 - 23 mg/dL    Creatinine, Ser 9.14  0.50 - 1.35 mg/dL    Calcium 9.2  8.4 - 78.2 mg/dL    GFR calc non Af Amer >90  >90 mL/min    GFR calc Af Amer >90  >90 mL/min   HEPATIC FUNCTION PANEL     Status: Abnormal   Collection Time   07/09/12  5:34 AM      Component Value Range Comment   Total Protein 6.1  6.0 - 8.3 g/dL    Albumin 3.2 (*) 3.5 - 5.2 g/dL    AST 92 (*) 0 - 37 U/L    ALT 214 (*) 0 - 53 U/L    Alkaline Phosphatase 106  39 - 117 U/L    Total Bilirubin 0.8  0.3 - 1.2 mg/dL    Bilirubin, Direct 0.2  0.0 - 0.3 mg/dL    Indirect Bilirubin 0.6  0.3 - 0.9 mg/dL     Dg Chest 2 View  95/62/1308  *RADIOLOGY REPORT*  Clinical Data: Cough and wheezing  CHEST - 2 VIEW  Comparison: 06/28/2012  Findings: Heart size is normal.  No pleural effusion or edema.  No airspace consolidation identified.  The visualized osseous structures are unremarkable.  IMPRESSION:  1.  No acute cardiopulmonary abnormalities.   Original Report Authenticated By: Rosealee Albee, M.D.    Mr Brain Wo Contrast  07/08/2012  *RADIOLOGY REPORT*  Clinical Data: History of chronic alcoholism presenting with altered mental status for 3 weeks.  Cough.  Noncooperative and combative.  MRI HEAD WITHOUT CONTRAST  Technique:  Multiplanar, multiecho pulse sequences of the brain and surrounding structures were obtained according to standard protocol without intravenous contrast.  Comparison: CT head 06/26/2012  Findings: The patient is extremely combative and uncooperative during scanning despite administration of that of an.  Despite multiple attempts, only axial diffusion weighted and ADC images were obtained.  The patient was not able to tolerate additional imaging and no additional images were obtained.  There is no  evidence of any focal restricted diffusion to suggest acute infarct.  Visualized ventricles and sulci appear symmetrical without mass effect or midline shift.  Small focal extra-axial fluid collection is noted in the anterior middle cranial fossa consistent with a small arachnoid cyst.  IMPRESSION: Technically limited study due to uncooperative patient.  Only diffusion weighted images obtained.  No evidence of restricted diffusion to suggest acute infarct.   Original Report Authenticated By: Marlon Pel, M.D.     No depression, No anxiety and Positive for anxiety, bad mood, depression and loss of his mother.  Blood pressure 111/62, pulse 67, temperature 98.4 F (36.9 C), temperature source Oral, resp. rate 18, height 5\' 8"  (1.727 m), weight 128 lb 1.6 oz (58.106 kg), SpO2 96.00%.   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 Patient also benefit from mild dose of benzodiazepine, Klonopin 0. 25 mg twice daily, and Ativan 1 mg as needed for catatonia. Depreciate psychiatric consult on this patient and the psychiatric consult Will follow as needed.  Mark Ho,JANARDHAHA R. 07/09/2012, 4:25 PM

## 2012-07-10 ENCOUNTER — Observation Stay (HOSPITAL_COMMUNITY): Payer: 59

## 2012-07-10 LAB — TSH: TSH: 1.992 u[IU]/mL (ref 0.350–4.500)

## 2012-07-10 LAB — VITAMIN B12: Vitamin B-12: 396 pg/mL (ref 211–911)

## 2012-07-10 LAB — PROTIME-INR: Prothrombin Time: 12 seconds (ref 11.6–15.2)

## 2012-07-10 MED ORDER — ENSURE COMPLETE PO LIQD
237.0000 mL | Freq: Two times a day (BID) | ORAL | Status: DC
  Administered 2012-07-12 – 2012-07-15 (×7): 237 mL via ORAL

## 2012-07-10 MED ORDER — LORAZEPAM 2 MG/ML IJ SOLN
1.0000 mg | Freq: Once | INTRAMUSCULAR | Status: AC
  Administered 2012-07-14: 1 mg via INTRAVENOUS
  Filled 2012-07-10: qty 1

## 2012-07-10 MED ORDER — LACTULOSE 10 GM/15ML PO SOLN
10.0000 g | Freq: Every day | ORAL | Status: DC
  Administered 2012-07-10 – 2012-07-19 (×8): 10 g via ORAL
  Filled 2012-07-10 (×10): qty 15

## 2012-07-10 MED ORDER — LORAZEPAM 2 MG/ML PO CONC: 1.0000 mg | Freq: Once | ORAL | Status: DC

## 2012-07-10 MED ORDER — LACTULOSE ENEMA
300.0000 mL | Freq: Once | ORAL | Status: DC
  Filled 2012-07-10: qty 300

## 2012-07-10 MED ORDER — LORAZEPAM 2 MG/ML IJ SOLN
1.0000 mg | Freq: Four times a day (QID) | INTRAMUSCULAR | Status: DC | PRN
  Administered 2012-07-12 – 2012-07-17 (×6): 1 mg via INTRAVENOUS
  Filled 2012-07-10 (×6): qty 1

## 2012-07-10 NOTE — Progress Notes (Signed)
Dr. Mahala Menghini made aware pt nonverbal, no longer following commands, flutters eyelids when stimulated;  Ativan 1 mg IV given;  Pt slightly more alert afterwards.  Dr. Mahala Menghini in to see pt.  RN continuing to monitor.

## 2012-07-10 NOTE — Progress Notes (Signed)
Dr. Mahala Menghini notified pt having episode of being nonverbal, not following commands, not withdrawing to pain.  Dr. Mahala Menghini to bedside; pt given ativan 1 mg IV per Dr. Pandora Leiter direction.  RN continuing to monitor.

## 2012-07-10 NOTE — Progress Notes (Signed)
[facility] PROGRESS NOTE  Mark Ho BJY:[id number] DOB: 27-Oct-1955 DOA: 07/08/2012 PCP: No primary provider on file.  Assessment/Plan: Active Problems:  Depression  Catatonia     56 year old male with episodes of decreased responsiveness, impaired movements, and urinary incontinence that have been waxing and waning for the past 3 weeks. Response to Ativan would suggest either catatonia absence status as an etiology for these episodes. The description of his movements, the association of his mother's death shortly before starting this, and the character of the waxing and waning nature make me think that catatonia is more likely than an epileptic etiology.  I interviewed to the patient's sister and brother today and they stated that the patient's mother actually passed away in 2023-10-16 of last year. Patient also sustained a loss of one of his grandchildren 3-4 years ago as a neonate and although he hasn't had any significant history of depression in the past he did mention to his brother couple of weeks ago in private that he has been feeling down.   Hypertension Blood pressures off therefore we'll DC lisinopril-monitor the trend and reassess  Transaminitis Will repeat LFTs--this shows a chronic elevation of ALT and a mild elevation of AST which is dropped slightly. I did order hepatitis panel, and INR which is normal and we will reassess the patient's labs in the morning. Patient's sister and brother state that patient does not take any Tylenol and has never had a blood transfusion so I'm unclear as to what the etiology is. This potentially could be nonalcoholic steatohepatitis?  Major Depression Continue Lexapro 10 mg, Klonopin 0.25 twice a day, Ativan 1 mg when necessary for catatonia-further recommendations to follow appreciate assistance    Code Status: full Family Communication: family updated at bedside x2 about clinical progress-have explained to them that this may be  seizure like activity versus catatonic state from depression. We will get more objective evidence including EEG and collateral information when patient is more awake. Disposition Plan:  As above    Brief narrative: HPI: Mark Ho is an 56 y.o. male who was in his normal state of health up until a few weeks ago when he began having episodes of confusion. Of note, his mother passed away shortly before that. His daughter reports that with the initial incident a few weeks ago, he ran out of gas, and called her. When she arrived at his truck, he was wandering around it, and seemed confused. She took him to the gas station and he proceeded to walk around the pumps rather than using them. He seemed to have a difficult time figuring out how to use a gas can, but with her help he was able to do it. He repeatedly stated "I'm just tired." Then about 6 days later, his family found him not responding to his girlfriend. He was taken to [facility] at that time where workup was reportedly negative (I do not have those records). Following discharge from [facility], he went home of his sister who states that he slowly improved up until he was completely back to his normal self over the course of about a week. Then 10/18 pm,, he returned to find him staying in a corner and saying that he peed on himself. He was persistently confused all day today, and was not walking correctly. His family states that he would not stand up completely and would move very slowly.  Therefore brought him to the emergency room tonight. He was taken for contrasted MRI, and became agitated and  was therefore given Ativan. On arrival back to the room, there have been a dramatic improvement in his family states that he was at his baseline.  Of note, at baseline he is cognitively slow. He is unable to read or write and when I asked a question "can you add 7+12," his sister indicates that she doesn't think he could do that normally. He does live with his  girlfriend, but takes care for himself. He works as a Scientific laboratory technician."   Consultants: Ritta Slot, MD   Procedures:  Chest x-ray 07/08/2012 = no acute cardiopulmonary abnormality  MRI 10/18 =-this was a technically limited study due to" the patient, on the diffusion-weighted images obtained is no evidence of restricted diffusion to suggest an acute infarct  Abdominal ultrasound = diminished exam detail due to overlying bowel gas, no acute findings noted  Antibiotics:  None  HPI/Subjective: Very flat affect. I was called by RN this morning given patient was "Beverly Sessions did arouse to voice and appeared sleepy however he was moving all 4 limbs equally. After a dose of Ativan patient became more alert. Subsequently patient again became somewhat somnolent and then was given a dose of Ativan again which seemed to wake him up. He does admit to being depressed and at some point has been suicidal although he does not have a definitive concrete plan. Have any other further complaints at present and goes back to sleep he's  Objective: Filed Vitals:   07/09/12 1354 07/09/12 2200 07/10/12 0600 07/10/12 0753  BP: 111/62 129/79 125/75 163/85  Pulse: 67 65 61 93  Temp: 98.4 F (36.9 C) 98 F (36.7 C) 98.3 F (36.8 C) 98.2 F (36.8 C)  TempSrc: Oral Oral Oral Oral  Resp: 18 18 20 16   Height:      Weight:      SpO2: 96% 96% 95% 96%    Intake/Output Summary (Last 24 hours) at 07/10/12 0828 Last data filed at 07/09/12 2200  Gross per 24 hour  Intake      0 ml  Output    400 ml  Net   -400 ml    Exam:  HENT:  Head: Atraumatic.  Nose: Nose normal.  Mouth/Throat: Oropharynx is clear and moist.  Eyes: Conjunctivae are normal. Pupils are equal, round, and reactive to light. No scleral icterus.  Neck: Neck supple. No tracheal deviation present.  Cardiovascular: Normal rate, regular rhythm, normal heart sounds and intact distal pulses.  Pulmonary/Chest: Effort normal  and breath sounds normal. No respiratory distress.  Abdominal: Soft. Normal appearance and bowel sounds are normal. She exhibits no distension. There is no tenderness.  Musculoskeletal: She exhibits no edema and no tenderness.  Neurological: She is alert. No cranial nerve deficit.    Data Reviewed: Basic Metabolic Panel:  Lab 07/09/12 7829 07/08/12 2000  NA 133* 134*  K 4.1 3.5  CL 98 98  CO2 29 28  GLUCOSE 93 96  BUN 9 10  CREATININE 0.64 0.53  CALCIUM 9.2 9.2  MG -- --  PHOS -- --    Liver Function Tests:  Lab 07/09/12 0534 07/08/12 2000  AST 92* 153*  ALT 214* 272*  ALKPHOS 106 110  BILITOT 0.8 0.7  PROT 6.1 6.5  ALBUMIN 3.2* 3.4*    Lab 07/08/12 2000  LIPASE 30  AMYLASE --    Lab 07/08/12 2237  AMMONIA 28    CBC:  Lab 07/09/12 0534 07/08/12 2000  WBC 12.9* 13.2*  NEUTROABS -- 10.7*  HGB 12.0*  12.2*  HCT 34.4* 34.7*  MCV 90.8 90.8  PLT 202 211    Cardiac Enzymes: No results found for this basename: CKTOTAL:5,CKMB:5,CKMBINDEX:5,TROPONINI:5 in the last 168 hours BNP (last 3 results) No results found for this basename: PROBNP:3 in the last 8760 hours   CBG:  Lab 07/10/12 0800  GLUCAP 98    No results found for this or any previous visit (from the past 240 hour(s)).   Studies: Dg Chest 2 View  07/08/2012  *RADIOLOGY REPORT*  Clinical Data: Cough and wheezing  CHEST - 2 VIEW  Comparison: 06/28/2012  Findings: Heart size is normal.  No pleural effusion or edema.  No airspace consolidation identified.  The visualized osseous structures are unremarkable.  IMPRESSION:  1.  No acute cardiopulmonary abnormalities.   Original Report Authenticated By: Rosealee Albee, M.D.    Dg Chest 2 View  07/01/2012  *RADIOLOGY REPORT*  Clinical Data: Cough, smoker  CHEST - 2 VIEW  Comparison: 06/23/2012  Findings: Normal heart size, mediastinal contours, and pulmonary vascularity. Lungs appear minimally hyperexpanded with mild chronic peribronchial thickening.  Probable bilateral nipple shadows, at least one of which is visible on the lateral view. No acute infiltrate, pleural effusion, or pneumothorax. No acute osseous findings.  IMPRESSION: Probable bilateral nipple shadows. Mild chronic bronchitic changes without acute infiltrate.   Original Report Authenticated By: Lollie Marrow, M.D.    Ct Head Wo Contrast  06/26/2012  *RADIOLOGY REPORT*  Clinical Data: Altered mental status.  CT HEAD WITHOUT CONTRAST  Technique:  Contiguous axial images were obtained from the base of the skull through the vertex without contrast.  Comparison: MRI 06/24/2012  Findings: No acute intracranial abnormality.  Specifically, no hemorrhage, hydrocephalus, mass lesion, acute infarction, or significant intracranial injury.  No acute calvarial abnormality. Visualized paranasal sinuses and mastoids clear.  Orbital soft tissues unremarkable.  IMPRESSION: No acute intracranial abnormality.   Original Report Authenticated By: Cyndie Chime, M.D.    Mr Brain Wo Contrast  07/08/2012  *RADIOLOGY REPORT*  Clinical Data: History of chronic alcoholism presenting with altered mental status for 3 weeks.  Cough.  Noncooperative and combative.  MRI HEAD WITHOUT CONTRAST  Technique:  Multiplanar, multiecho pulse sequences of the brain and surrounding structures were obtained according to standard protocol without intravenous contrast.  Comparison: CT head 06/26/2012  Findings: The patient is extremely combative and uncooperative during scanning despite administration of that of an.  Despite multiple attempts, only axial diffusion weighted and ADC images were obtained.  The patient was not able to tolerate additional imaging and no additional images were obtained.  There is no evidence of any focal restricted diffusion to suggest acute infarct.  Visualized ventricles and sulci appear symmetrical without mass effect or midline shift.  Small focal extra-axial fluid collection is noted in the anterior  middle cranial fossa consistent with a small arachnoid cyst.  IMPRESSION: Technically limited study due to uncooperative patient.  Only diffusion weighted images obtained.  No evidence of restricted diffusion to suggest acute infarct.   Original Report Authenticated By: Marlon Pel, M.D.     Scheduled Meds:    . aspirin  325 mg Oral Daily  . escitalopram  10 mg Oral Daily  . heparin  5,000 Units Subcutaneous Q8H  . pantoprazole  40 mg Oral Daily  . DISCONTD: lisinopril  10 mg Oral Daily   Continuous Infusions:   Active Problems:  Depression  Catatonia    Time spent: 40 minutes   Arantza Darrington, [id number]  [facility] Pager [phone]. If 8PM-8AM, please contact night-coverage at [email or web address], password [facility] 07/10/2012, 8:28 AM  LOS: 2 days

## 2012-07-10 NOTE — Progress Notes (Signed)
Dr. Mahala Menghini notified pt improved, following commands , answers simple questions.  RN continuing to monitor.

## 2012-07-10 NOTE — Progress Notes (Signed)
INITIAL ADULT NUTRITION ASSESSMENT Date: 07/10/2012   Time: 3:49 PM Reason for Assessment: Nutrition Risk  ASSESSMENT: Male 55 y.o.  Dx: Depression, catatonia  Hx:  Past Medical History  Diagnosis Date  . Esophageal stricture   . Hiatal hernia    History reviewed. No pertinent past surgical history.  Related Meds:     . aspirin  325 mg Oral Daily  . escitalopram  10 mg Oral Daily  . heparin  5,000 Units Subcutaneous Q8H  . lactulose  10 g Oral Daily  . lactulose  300 mL Rectal Once  . LORazepam  1 mg Intravenous Once  . pantoprazole  40 mg Oral Daily  . DISCONTD: LORazepam  1 mg Oral Once     Ht: 5\' 8"  (172.7 cm)  Wt: 128 lb 1.6 oz (58.106 kg)  Ideal Wt: 70 kg % Ideal Wt: 83  Usual Wt:  Wt Readings from Last 10 Encounters:  07/09/12 128 lb 1.6 oz (58.106 kg)  06/26/12 127 lb (57.607 kg)    % Usual Wt: unknown  Body mass index is 19.48 kg/(m^2).  Labs:  CMP     Component Value Date/Time   NA 133* 07/09/2012 0534   K 4.1 07/09/2012 0534   CL 98 07/09/2012 0534   CO2 29 07/09/2012 0534   GLUCOSE 93 07/09/2012 0534   BUN 9 07/09/2012 0534   CREATININE 0.64 07/09/2012 0534   CALCIUM 9.2 07/09/2012 0534   PROT 6.1 07/09/2012 0534   ALBUMIN 3.2* 07/09/2012 0534   AST 92* 07/09/2012 0534   ALT 214* 07/09/2012 0534   ALKPHOS 106 07/09/2012 0534   BILITOT 0.8 07/09/2012 0534   GFRNONAA >90 07/09/2012 0534   GFRAA >90 07/09/2012 0534    I/O last 3 completed shifts: In: 240 [P.O.:240] Out: 400 [Urine:400]     Diet Order: Dysphagia 3 thin  Supplements/Tube Feeding:  none  IVF:    Estimated Nutritional Needs:   Kcal: 1800-2000 Protein: 75-85 gm Fluid: 1.5-1.7L  Food/Nutrition Related Hx: unknown. Pt has been experiencing confusion since death of mother.  Per RN pt not eating.  Did take an Ensure.  Pt with recent weight loss of unknown amounts.  Pt thin at 83% IBW.  BMI WNL  NUTRITION DIAGNOSIS: -Inadequate oral intake (NI-2.1).  Status:  Ongoing  RELATED TO: mental status  AS EVIDENCE BY: documented and reported poor intake  MONITORING/EVALUATION(Goals): Intake, labs, weight Goal:  Intake of >75% meals and supplements  EDUCATION NEEDS: -No education needs identified at this time  INTERVENTION: Ensure Complete bid Discussed with RN  Dietitian [phone]  DOCUMENTATION CODES Per approved criteria  -Not Applicable    Jeoffrey Massed 07/10/2012, 3:49 PM

## 2012-07-11 ENCOUNTER — Observation Stay (HOSPITAL_COMMUNITY): Admit: 2012-07-11 | Discharge: 2012-07-11 | Disposition: A | Discharge status: Home / Self Care | Payer: 59

## 2012-07-11 ENCOUNTER — Ambulatory Visit (HOSPITAL_COMMUNITY)
Admit: 2012-07-11 | Discharge: 2012-07-11 | Disposition: A | Discharge status: Home / Self Care | Payer: 59 | Source: Ambulatory Visit | Attending: Internal Medicine | Admitting: Internal Medicine

## 2012-07-11 ENCOUNTER — Encounter (HOSPITAL_COMMUNITY): Payer: Self-pay | Admitting: Radiology

## 2012-07-11 DIAGNOSIS — F1010 Alcohol abuse, uncomplicated: Secondary | ICD-10-CM | POA: Insufficient documentation

## 2012-07-11 DIAGNOSIS — R569 Unspecified convulsions: Secondary | ICD-10-CM | POA: Insufficient documentation

## 2012-07-11 LAB — CBC WITH DIFFERENTIAL/PLATELET
Eosinophils Relative: 1 % (ref 0–5)
HCT: 39.6 % (ref 39.0–52.0)
Hemoglobin: 13.8 g/dL (ref 13.0–17.0)
Lymphocytes Relative: 27 % (ref 12–46)
Lymphs Abs: 1.4 10*3/uL (ref 0.7–4.0)
MCV: 90.8 fL (ref 78.0–100.0)
Monocytes Absolute: 0.5 10*3/uL (ref 0.1–1.0)
Monocytes Relative: 10 % (ref 3–12)
RBC: 4.36 MIL/uL (ref 4.22–5.81)
WBC: 5.2 10*3/uL (ref 4.0–10.5)

## 2012-07-11 LAB — HEPATITIS PANEL, ACUTE
HCV Ab: NEGATIVE
Hep A IgM: NEGATIVE
Hep B C IgM: NEGATIVE

## 2012-07-11 LAB — HIV ANTIBODY (ROUTINE TESTING W REFLEX): HIV: NONREACTIVE

## 2012-07-11 MED ORDER — MIDAZOLAM HCL 2 MG/2ML IJ SOLN
INTRAMUSCULAR | Status: AC | PRN
  Administered 2012-07-11: 1 mg via INTRAVENOUS

## 2012-07-11 MED ORDER — FENTANYL CITRATE 0.05 MG/ML IJ SOLN
INTRAMUSCULAR | Status: AC
  Filled 2012-07-11: qty 4

## 2012-07-11 MED ORDER — FENTANYL CITRATE 0.05 MG/ML IJ SOLN
INTRAMUSCULAR | Status: AC | PRN
  Administered 2012-07-11: 25 ug via INTRAVENOUS
  Administered 2012-07-11: 50 ug via INTRAVENOUS

## 2012-07-11 MED ORDER — FENTANYL CITRATE 0.05 MG/ML IJ SOLN: 25.0000 ug | INTRAMUSCULAR | Status: DC | PRN

## 2012-07-11 MED ORDER — LORAZEPAM 2 MG/ML IJ SOLN
INTRAMUSCULAR | Status: AC | PRN
  Administered 2012-07-11: 1 mg via INTRAVENOUS

## 2012-07-11 MED ORDER — MIDAZOLAM HCL 2 MG/2ML IJ SOLN
INTRAMUSCULAR | Status: AC
  Filled 2012-07-11: qty 10

## 2012-07-11 MED ORDER — CYANOCOBALAMIN 1000 MCG/ML IJ SOLN
1000.0000 ug | Freq: Once | INTRAMUSCULAR | Status: AC
  Administered 2012-07-11: 1000 ug via INTRAMUSCULAR
  Filled 2012-07-11: qty 1

## 2012-07-11 MED ORDER — DEXTROSE-NACL 5-0.9 % IV SOLN
INTRAVENOUS | Status: DC
  Administered 2012-07-11 – 2012-07-16 (×5): via INTRAVENOUS

## 2012-07-11 MED ORDER — MIDAZOLAM HCL 2 MG/2ML IJ SOLN: 1.0000 mg | INTRAMUSCULAR | Status: DC | PRN

## 2012-07-11 MED ORDER — SODIUM CHLORIDE 0.9 % IV SOLN
INTRAVENOUS | Status: AC | PRN
  Administered 2012-07-11: 75 mL/h via INTRAVENOUS

## 2012-07-11 MED ORDER — GADOBENATE DIMEGLUMINE 529 MG/ML IV SOLN
12.0000 mL | Freq: Once | INTRAVENOUS | Status: AC | PRN
  Administered 2012-07-11: 12 mL via INTRAVENOUS

## 2012-07-11 NOTE — Progress Notes (Signed)
Progress Note following cosultation Patient Identification:  Mark Ho Date of Evaluation:  07/11/2012 Reason for Consult:Depression related to mother's death  Referring Provider: Dr. Bonnell Public  Assessment/Plan: Pt is out of room.  Evaluation will continue in am  Zephyra Bernardi J. Ferol Luz, MD Psychiatrist 07/11/2012 10:36 PM

## 2012-07-11 NOTE — Progress Notes (Signed)
Mr Reesor arrived at [facility] for MRI with sedation.  VSS. Barely opens eyes to name.  Follows commands to put bp cuff on.  Not combative or uncooperative at this time.  Spoke with Dr Karin Golden, will attempt to do MRI without sedation due to somnolent state at this time.  Brought to MRI area on stretcher, informing pt of events with no response from pt.

## 2012-07-11 NOTE — Progress Notes (Signed)
Report given to RN on care link, pt transported via ambulance to [facility] hospital for MRI.

## 2012-07-11 NOTE — ED Notes (Signed)
O2 2L/[place] starte

## 2012-07-11 NOTE — Progress Notes (Signed)
EEG COMPLETED

## 2012-07-11 NOTE — Progress Notes (Signed)
History:  Mark Ho is an 56 y.o. male who was in his normal state of health up until a few weeks ago when he began having episodes of confusion. Of note, his mother passed away shortly before that. His daughter reports that with the initial incident a few weeks ago, he ran out of gas, and called her. When she arrived at his truck, he was wandering around it, and seemed confused. She took him to the gas station and he proceeded to walk around the pumps rather than using them. He seemed to have a difficult time figuring out how to use a gas can, but with her help he was able to do it. He repeatedly stated "I'm just tired." Then about 6 days later, his family found him not responding to his girlfriend. He was taken to [facility] at that time where workup was reportedly negative (I do not have those records). Following discharge from [facility], he went home of his sister who states that he slowly improved up until he was completely back to his normal self over the course of about a week.   Then last night, he returned to find him staying in a corner and saying that he peed on himself. He was persistently confused all day today, and was not walking correctly. His family states that he would not stand up completely and would move very slowly.  Therefore brought him to the emergency room tonight. He was taken for contrasted MRI, and became agitated and was therefore given Ativan. On arrival back to the room, there have been a dramatic improvement in his family states that he was at his baseline.   Of note, at baseline he is cognitively slow. He is unable to read or write and when I asked a question "can you add 7+12," his sister indicates that she doesn't think he could do that normally. He does live with his girlfriend, but takes care for himself. He works as a Scientific laboratory technician."  Subjective:  He is sitting up in bed. Appears groggy. Answers my questions in a whisper. Some of which he does not know the  answer. He says the year is 2014. He does not know where he is. He does follow commands. Says he has never had liver problems.   Objective: BP 117/76  Pulse 73  Temp 98.2 F (36.8 C) (Oral)  Resp 18  Ht 5\' 8"  (1.727 m)  Wt 58.106 kg (128 lb 1.6 oz)  BMI 19.48 kg/m2  SpO2 95%  CBGs  Basename 07/10/12 0800  GLUCAP 98     Medications: Scheduled:   . aspirin  325 mg Oral Daily  . escitalopram  10 mg Oral Daily  . feeding supplement  237 mL Oral BID BM  . heparin  5,000 Units Subcutaneous Q8H  . lactulose  10 g Oral Daily  . lactulose  300 mL Rectal Once  . LORazepam  1 mg Intravenous Once  . pantoprazole  40 mg Oral Daily  . DISCONTD: LORazepam  1 mg Oral Once    Neurologic Exam: Mental Status: Groggry not oriented, thought content difficult to ascertain due to whispering. Able to follow 3 step commands without difficulty. Cranial Nerves: II- Visual fields grossly intact. III/IV/VI-Extraocular movements intact.  Pupils reactive bilaterally. V/VII-Smile symmetric VIII-hearing grossly intact IX/X-normal gag XI-bilateral shoulder shrug XII-midline tongue extension, His tongue looks red, beefy at the tip with demarkations, no white lesions. Motor: 5/5 Upper bilaterally, 4/5 LE bilaterally with normal tone and bulk Sensory: Light touch  intact throughout, bilaterally Deep Tendon Reflexes: 2+ and symmetric throughout Plantars: Downgoing bilaterally Cerebellar: Normal finger-to-nose. Too groggy to walk.   Lab Results: CBC:  Lab 07/09/12 0534 07/08/12 2000  WBC 12.9* 13.2*  NEUTROABS -- 10.7*  HGB 12.0* 12.2*  HCT 34.4* 34.7*  MCV 90.8 90.8  PLT 202 211   Basic Metabolic Panel:  Lab 07/09/12 4098 07/08/12 2000  NA 133* 134*  K 4.1 3.5  CL 98 98  CO2 29 28  GLUCOSE 93 96  BUN 9 10  CREATININE 0.64 0.53  CALCIUM 9.2 9.2  MG -- --  PHOS -- --   Liver Function Tests:  Lab 07/09/12 0534 07/08/12 2000  AST 92* 153*  ALT 214* 272*  ALKPHOS 106 110    BILITOT 0.8 0.7  PROT 6.1 6.5  ALBUMIN 3.2* 3.4*    Thyroid Function Tests:  Lab 07/10/12 0832  TSH 1.992  T4TOTAL --  FREET4 --  T3FREE --  THYROIDAB --   Coagulation:  Lab 07/10/12 0832  LABPROT 12.0  INR 0.89     Study Results:  US Abdomen Complete 07/10/2012  IMPRESSION:  1.  Diminished exam detail due to overlying bowel gas.  No acute findings noted.   Original Report Authenticated By: Rosealee Albee, M.D.    MRI Frances Nickels Contrast 07/08/2012 Technically limited study due to uncooperative patient. Only diffusion weighted images obtained. No evidence of restricted diffusion to suggest acute infarct.  CXR 07/08/2012 Technically limited study due to uncooperative patient. Only diffusion weighted images obtained. No evidence of restricted diffusion to suggest acute infarct.    Assessment/Plan:   Catatonia, psychiatry evaluation   Acute delerium, order RPR/HIV panels  Encephalopathy, hepatic, on lactulose  Elevated liver enzymes, concern that lexapro could be additive cause. Hepatitis panel normal.  Leukocytosis, trending downward, but in this situation could be seen with generalized sepsis.  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

## 2012-07-11 NOTE — Progress Notes (Signed)
[facility] PROGRESS NOTE  Mark Ho YQM:[id number] DOB: 20-Sep-1956 DOA: 07/08/2012 PCP: No primary provider on file.  Assessment/Plan: Active Problems:  Depression  Catatonia     56 year old male with episodes of decreased responsiveness, impaired movements, and urinary incontinence that have been waxing and waning for the past 3 weeks. Response to Ativan would suggest either catatonia absence status as an etiology for these episodes. The description of his movements, the association of his mother's death shortly before starting this, and the character of the waxing and waning nature make me think that catatonia is more likely than an epileptic etiology.  I interviewed to the patient's sister and brother today and they stated that the patient's mother actually passed away in Oct 12, 2023 of last year. Patient also sustained a loss of one of his grandchildren 3-4 years ago as a neonate and although he hasn't had any significant history of depression in the past he did mention to his brother couple of weeks ago in private that he has been feeling down.   Hypertension Blood pressures off therefore we'll DC lisinopril-monitor the trend and reassess  Transaminitis Will repeat LFTs--this shows a chronic elevation of ALT and a mild elevation of AST which is dropped slightly. I did order hepatitis panel, and INR which is normal and we will reassess the patient's labs in the morning. Patient's sister and brother state that patient does not take any Tylenol and has never had a blood transfusion so I'm unclear as to what the etiology is. This potentially could be nonalcoholic steatohepatitis?  Major Depression Continue Lexapro 10 mg, Klonopin 0.25 twice a day, Ativan 1 mg when necessary for catatonia-further recommendations to follow appreciate assistance  Metabolic Encephalopathy,? Wernicke's Lab work for encephalopathy including RPR, HIV, TSH,, drug screen, urinalysis, acute hepatitis panel,  B12, TSH were all normal-total-we can get a heavy metal screen and broaden the search for the etiology-Low concern at present for infectious etiology, given his symptome improved with Ativan and he is more awake now Patient had MRI on 07/11/2012 that showed Wernicke's encephalopathy?. HisB12 level is normal. We will attempt IM trial of vitamin B12 to see if this makes a change in mental status-if this does not, I would potentially lean towards more of a psychiatric case in terms of this being catatonia. Patient has been seen on the 19th by psychiatry and we await their other formal input and suggestions for management    Code Status: full Family Communication: family updated at bedside x2 about clinical progress-have explained to them that this may be seizure like activity versus catatonic state from depression. We will get more objective evidence including EEG and collateral information when patient is more awake. Disposition Plan:  As above    Brief narrative: HPI: Mark Ho is an 56 y.o. male who was in his normal state of health up until a few weeks ago when he began having episodes of confusion. Of note, his mother passed away shortly before that. His daughter reports that with the initial incident a few weeks ago, he ran out of gas, and called her. When she arrived at his truck, he was wandering around it, and seemed confused. She took him to the gas station and he proceeded to walk around the pumps rather than using them. He seemed to have a difficult time figuring out how to use a gas can, but with her help he was able to do it. He repeatedly stated "I'm just tired." Then about 6 days later, his family  found him not responding to his girlfriend. He was taken to [facility] at that time where workup was reportedly negative (I do not have those records). Following discharge from [facility], he went home of his sister who states that he slowly improved up until he was completely back to his normal  self over the course of about a week. Then 10/18 pm,, he returned to find him staying in a corner and saying that he peed on himself. He was persistently confused all day today, and was not walking correctly. His family states that he would not stand up completely and would move very slowly.  Therefore brought him to the emergency room tonight. He was taken for contrasted MRI, and became agitated and was therefore given Ativan. On arrival back to the room, there have been a dramatic improvement in his family states that he was at his baseline.  Of note, at baseline he is cognitively slow. He is unable to read or write and when I asked a question "can you add 7+12," his sister indicates that she doesn't think he could do that normally. He does live with his girlfriend, but takes care for himself. He works as a Scientific laboratory technician."   Consultants: Ritta Slot, MD   Procedures:  Chest x-ray 07/08/2012 = no acute cardiopulmonary abnormality  MRI 10/18 =-this was a technically limited study due to" the patient, on the diffusion-weighted images obtained is no evidence of restricted diffusion to suggest an acute infarct  Abdominal ultrasound = diminished exam detail due to overlying bowel gas, no acute findings noted  EEG performed 10/21 is pending  MRI head 10/21= slightly motion degraded exam without acute abnormality, nonspecific white matter type changes as today detailed above, potential MRI findings of Wernicke's encephalopathy  Antibiotics:  None  HPI/Subjective: Very flat affect. Very low speech volume and output, barely opens his eyes but opens them when He does admit to being depressed and at some point has been suicidal although he does not have a definitive concrete plan.   Objective: Filed Vitals:   07/10/12 0753 07/10/12 1429 07/10/12 2200 07/11/12 0600  BP: 163/85 114/75 123/76 117/76  Pulse: 93 85 76 73  Temp: 98.2 F (36.8 C) 98.4 F (36.9 C) 98.5 F (36.9 C) 98.2  F (36.8 C)  TempSrc: Oral Oral Oral Oral  Resp: 16 16 18 18   Height:      Weight:      SpO2: 96% 96% 97% 95%    Intake/Output Summary (Last 24 hours) at 07/11/12 1138 Last data filed at 07/11/12 0900  Gross per 24 hour  Intake    240 ml  Output      0 ml  Net    240 ml    Exam:  HENT:  Head: Atraumatic.  Nose: Nose normal.  Mouth/Throat: Oropharynx is clear and moist.  Eyes: Conjunctivae are normal. Pupils are equal, round, and reactive to light. No scleral icterus.  Neck: Neck supple. No tracheal deviation present.  Cardiovascular: Normal rate, regular rhythm, normal heart sounds and intact distal pulses.  Pulmonary/Chest: Effort normal and breath sounds normal. No respiratory distress.  Abdominal: Soft. Normal appearance and bowel sounds are normal. She exhibits no distension. There is no tenderness.  Musculoskeletal: no edema and no tenderness.  Neurological: rousable. No cranial nerve deficit.  Neg Brudzinski/Kernig's    Data Reviewed: Basic Metabolic Panel:  Lab 07/09/12 5409 07/08/12 2000  NA 133* 134*  K 4.1 3.5  CL 98 98  CO2 29 28  GLUCOSE 93 96  BUN 9 10  CREATININE 0.64 0.53  CALCIUM 9.2 9.2  MG -- --  PHOS -- --    Liver Function Tests:  Lab 07/09/12 0534 07/08/12 2000  AST 92* 153*  ALT 214* 272*  ALKPHOS 106 110  BILITOT 0.8 0.7  PROT 6.1 6.5  ALBUMIN 3.2* 3.4*    Lab 07/08/12 2000  LIPASE 30  AMYLASE --    Lab 07/08/12 2237  AMMONIA 28    CBC:  Lab 07/09/12 0534 07/08/12 2000  WBC 12.9* 13.2*  NEUTROABS -- 10.7*  HGB 12.0* 12.2*  HCT 34.4* 34.7*  MCV 90.8 90.8  PLT 202 211    Cardiac Enzymes: No results found for this basename: CKTOTAL:5,CKMB:5,CKMBINDEX:5,TROPONINI:5 in the last 168 hours BNP (last 3 results) No results found for this basename: PROBNP:3 in the last 8760 hours   CBG:  Lab 07/10/12 0800  GLUCAP 98    No results found for this or any previous visit (from the past 240 hour(s)).   Studies: Dg  Chest 2 View  07/08/2012  *RADIOLOGY REPORT*  Clinical Data: Cough and wheezing  CHEST - 2 VIEW  Comparison: 06/28/2012  Findings: Heart size is normal.  No pleural effusion or edema.  No airspace consolidation identified.  The visualized osseous structures are unremarkable.  IMPRESSION:  1.  No acute cardiopulmonary abnormalities.   Original Report Authenticated By: Rosealee Albee, M.D.    Dg Chest 2 View  07/01/2012  *RADIOLOGY REPORT*  Clinical Data: Cough, smoker  CHEST - 2 VIEW  Comparison: 06/23/2012  Findings: Normal heart size, mediastinal contours, and pulmonary vascularity. Lungs appear minimally hyperexpanded with mild chronic peribronchial thickening. Probable bilateral nipple shadows, at least one of which is visible on the lateral view. No acute infiltrate, pleural effusion, or pneumothorax. No acute osseous findings.  IMPRESSION: Probable bilateral nipple shadows. Mild chronic bronchitic changes without acute infiltrate.   Original Report Authenticated By: Lollie Marrow, M.D.    Ct Head Wo Contrast  06/26/2012  *RADIOLOGY REPORT*  Clinical Data: Altered mental status.  CT HEAD WITHOUT CONTRAST  Technique:  Contiguous axial images were obtained from the base of the skull through the vertex without contrast.  Comparison: MRI 06/24/2012  Findings: No acute intracranial abnormality.  Specifically, no hemorrhage, hydrocephalus, mass lesion, acute infarction, or significant intracranial injury.  No acute calvarial abnormality. Visualized paranasal sinuses and mastoids clear.  Orbital soft tissues unremarkable.  IMPRESSION: No acute intracranial abnormality.   Original Report Authenticated By: Cyndie Chime, M.D.    Mr Brain Wo Contrast  07/08/2012  *RADIOLOGY REPORT*  Clinical Data: History of chronic alcoholism presenting with altered mental status for 3 weeks.  Cough.  Noncooperative and combative.  MRI HEAD WITHOUT CONTRAST  Technique:  Multiplanar, multiecho pulse sequences of the brain  and surrounding structures were obtained according to standard protocol without intravenous contrast.  Comparison: CT head 06/26/2012  Findings: The patient is extremely combative and uncooperative during scanning despite administration of that of an.  Despite multiple attempts, only axial diffusion weighted and ADC images were obtained.  The patient was not able to tolerate additional imaging and no additional images were obtained.  There is no evidence of any focal restricted diffusion to suggest acute infarct.  Visualized ventricles and sulci appear symmetrical without mass effect or midline shift.  Small focal extra-axial fluid collection is noted in the anterior middle cranial fossa consistent with a small arachnoid cyst.  IMPRESSION: Technically limited study due to  uncooperative patient.  Only diffusion weighted images obtained.  No evidence of restricted diffusion to suggest acute infarct.   Original Report Authenticated By: Marlon Pel, M.D.     Scheduled Meds:    . aspirin  325 mg Oral Daily  . escitalopram  10 mg Oral Daily  . feeding supplement  237 mL Oral BID BM  . heparin  5,000 Units Subcutaneous Q8H  . lactulose  10 g Oral Daily  . lactulose  300 mL Rectal Once  . LORazepam  1 mg Intravenous Once  . pantoprazole  40 mg Oral Daily  . DISCONTD: LORazepam  1 mg Oral Once   Continuous Infusions:   Active Problems:  Depression  Catatonia    Time spent: 40 minutes   Mahala Menghini [facility]  [facility] Pager [phone]. If 8PM-8AM, please contact night-coverage at [email or web address], password [facility] 07/11/2012, 11:38 AM  LOS: 3 days

## 2012-07-11 NOTE — ED Notes (Signed)
MRI complete, moved to stretcher

## 2012-07-11 NOTE — ED Notes (Signed)
MRI attempted without sedation.  Too much movement though cooperative.  Beckey Downing in to see pt.  Consent obtained from sister.

## 2012-07-11 NOTE — ED Notes (Signed)
02 2l/Trail started prior to case

## 2012-07-12 LAB — COMPREHENSIVE METABOLIC PANEL
ALT: 86 U/L — ABNORMAL HIGH (ref 0–53)
AST: 15 U/L (ref 0–37)
Albumin: 3.4 g/dL — ABNORMAL LOW (ref 3.5–5.2)
Alkaline Phosphatase: 105 U/L (ref 39–117)
BUN: 16 mg/dL (ref 6–23)
CO2: 27 mEq/L (ref 19–32)
Calcium: 9.6 mg/dL (ref 8.4–10.5)
Chloride: 100 mEq/L (ref 96–112)
Creatinine, Ser: 0.59 mg/dL (ref 0.50–1.35)
GFR calc Af Amer: >90 mL/min (ref >90)
GFR calc non Af Amer: >90 mL/min (ref >90)
Glucose, Bld: 110 mg/dL — ABNORMAL HIGH (ref 70–99)
Potassium: 3.9 mEq/L (ref 3.5–5.1)
Sodium: 139 mEq/L (ref 135–145)
Total Bilirubin: 0.4 mg/dL (ref 0.3–1.2)
Total Protein: 7.2 g/dL (ref 6.0–8.3)

## 2012-07-12 NOTE — Progress Notes (Signed)
Patient crying and shaking, mostly on left side of body went entered room. Held right hand. Did not feel tremors there. Patient told me he wants to go be with God and his mother. He does not understand why he is still here. He is Catholic, but not currently attending a church. I asked him about prayer. He did want prayer. Prayed for him. He was very soft spoken during visit and it was difficult to hear him at times.  07/12/12 1600  Clinical Encounter Type  Visited With Patient  Visit Type Spiritual support  Referral From Nurse  Recommendations Follow up

## 2012-07-12 NOTE — Procedures (Signed)
REFERRING PHYSICIAN:  Pleas Koch, MD  INDICATION FOR STUDY:  A 56 year old man with altered mental status with clinical depression as well as staring spells and catatonia.  Study is being performed to rule out possible focal seizure activity.  DESCRIPTION:  This is a routine EEG recording during wakefulness and during sleep.  The patient was noted to be somewhat lethargic during the study initially.  Predominant background activity consisted of low amplitude, 1-2 Hz diffuse, somewhat irregular, symmetrical delta activity.  There was superimposed 7-8 Hz rhythmic activity recorded from the posterior head regions as well as low-amplitude, 20 Hz beta activity recorded at times from the frontal and central regions.  Photic stimulation produced a symmetrical occipital driving response. Hyperventilation was not performed.  During sleep, there was further slowing of cerebral activity with mixed irregular delta and theta activity along with symmetrical sleep spindles and K complexes.  No epileptiform discharges were recorded.  There were no areas of disproportionate focal slowing.  INTERPRETATION:  EEG showed mild generalized nonspecific slowing of cerebral activity, which can be seen with metabolic and degenerative central nervous system disorders, as well as with sedating medications. No evidence of an epileptic disorder was demonstrated.     Noel Christmas, MD    JY:NWGN D:  07/11/2012 14:24:21  T:  07/12/2012 03:05:00  Job #:  [id number]

## 2012-07-12 NOTE — Progress Notes (Signed)
Note to MD - please call sister regarding results of MRI and any progress of patient.  Daughter has called twice this am to check on patient but is at work and asked MD to call sister.

## 2012-07-12 NOTE — Progress Notes (Signed)
[facility] PROGRESS NOTE  Farah Benish ZOX:[id number] DOB: 08-28-1956 DOA: 07/08/2012 PCP: No primary provider on file.  Assessment/Plan: Active Problems:  Depression  Catatonia     56 year old male with episodes of decreased responsiveness, impaired movements, and urinary incontinence that have been waxing and waning for the past 3 weeks. Response to Ativan would suggest either catatonia absence status as an etiology for these episodes. The description of his movements, the association of his mother's death shortly before starting this, and the character of the waxing and waning nature make me think that catatonia is more likely than an epileptic etiology.  I interviewed to the patient's sister and brother 10/18 and they stated that the patient's mother actually passed away in 21-Sep-2023 of last year. Patient also sustained a loss of one of his grandchildren 3-4 years ago as a neonate and although he hasn't had any significant history of depression in the past he did mention to his brother couple of weeks ago in private that he has been feeling down.  Patient had an extensive workup in hospital as per below which showed that the patient did not have any significant organic mental pathology. Currently a heavy metal screen is pending however patient is much more alert oriented and is actually asking to go home. I spoke with psychiatrist 07/12/2012 p.m. 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 Pending is further and final disposition per psychiatrist however from a medical standpoint patient is stable for discharge.   Hypertension Blood pressures off therefore we'll DC lisinopril-monitor the trend and reassess  Transaminitis Will repeat LFTs--this shows a chronic elevation of ALT and a mild elevation of AST which is dropped slightly. I did order hepatitis panel, and INR which is normal and we will  reassess the patient's labs in the morning. Patient's sister and brother state that patient does not take any Tylenol and has never had a blood transfusion so I'm unclear as to what the etiology is. This potentially could be nonalcoholic steatohepatitis? I would check complete metabolic panels every 6 months-I. suspect strongly that patient has a history of either alcohol habituation or drug ingestion. This could be of course related to Lexapro which can be adjusted by psychiatry to a less hepatotoxic SSI  Major Depression Continue Lexapro 10 mg, Klonopin 0.25 twice a day, Ativan 1 mg when necessary for catatonia-further recommendations to follow appreciate assistance  Metabolic Encephalopathy,? Wernicke's Lab work for encephalopathy including RPR, HIV, TSH,, drug screen, urinalysis, acute hepatitis panel, B12, TSH were all normal-total-we can get a heavy metal screen and broaden the search for the etiology-Low concern at present for infectious etiology, given his symptome improved with Ativan and he is more awake now Patient had MRI on 07/11/2012 that showed Wernicke's encephalopathy?. HisB12 level is normal. We will attempt IM trial of vitamin B12 to see if this makes a change in mental status-if this does not, I would potentially lean towards more of a psychiatric case in terms of this being catatonia. Patient has been seen on the 19th by psychiatry and we await their other formal input and suggestions for management    Code Status: full Family Communication: family updated at bedside x2 about clinical progress-have explained to them that this may be seizure like activity versus catatonic state from depression. We will get more objective evidence including EEG and collateral information when patient is more awake. Disposition Plan:  As above  Brief narrative: HPI: Alder Murri is an 56 y.o. male who was in his normal state of health up until a few weeks ago when he began having episodes of  confusion. Of note, his mother passed away shortly before that. His daughter reports that with the initial incident a few weeks ago, he ran out of gas, and called her. When she arrived at his truck, he was wandering around it, and seemed confused. She took him to the gas station and he proceeded to walk around the pumps rather than using them. He seemed to have a difficult time figuring out how to use a gas can, but with her help he was able to do it. He repeatedly stated "I'm just tired." Then about 6 days later, his family found him not responding to his girlfriend. He was taken to [facility] at that time where workup was reportedly negative (I do not have those records). Following discharge from [facility], he went home of his sister who states that he slowly improved up until he was completely back to his normal self over the course of about a week. Then 10/18 pm,, he returned to find him staying in a corner and saying that he peed on himself. He was persistently confused all day today, and was not walking correctly. His family states that he would not stand up completely and would move very slowly.  Therefore brought him to the emergency room tonight. He was taken for contrasted MRI, and became agitated and was therefore given Ativan. On arrival back to the room, there have been a dramatic improvement in his family states that he was at his baseline.  Of note, at baseline he is cognitively slow. He is unable to read or write and when I asked a question "can you add 7+12," his sister indicates that she doesn't think he could do that normally. He does live with his girlfriend, but takes care for himself. He works as a Scientific laboratory technician."   Consultants:  Neurology-[employer]Neill Systems analyst, MD  Psychiatrist   Procedures:  Chest x-ray 07/08/2012 = no acute cardiopulmonary abnormality  MRI 10/18 =-this was a technically limited study due to" the patient, on the diffusion-weighted images obtained is no evidence  of restricted diffusion to suggest an acute infarct  Abdominal ultrasound = diminished exam detail due to overlying bowel gas, no acute findings noted  EEG performed 10/21 is pending  MRI head 10/21= slightly motion degraded exam without acute abnormality, nonspecific white matter type changes as today detailed above, potential MRI findings of Wernicke's encephalopathy  Antibiotics:  None  HPI/Subjective: Very flat affect. Very low speech volume events from today noted. Patient is much more alert and oriented He states that he wishes to go home   Objective: Filed Vitals:   07/12/12 1414 07/12/12 1525 07/12/12 1544 07/12/12 1608  BP: 148/86 179/98 142/88 124/82  Pulse: 94 114 103 96  Temp: 98.2 F (36.8 C) 98.1 F (36.7 C)    TempSrc: Oral Oral    Resp: 16 20 22 18   Height:      Weight:      SpO2: 94% 91% 91% 96%    Intake/Output Summary (Last 24 hours) at 07/12/12 2004 Last data filed at 07/12/12 1900  Gross per 24 hour  Intake    740 ml  Output    400 ml  Net    340 ml    Exam:  HENT:  Head: Atraumatic.  Nose: Nose normal.  Mouth/Throat: Oropharynx is clear and moist.  Eyes: Conjunctivae are normal. Pupils are equal, round, and reactive to light. No scleral icterus.  Neck: Neck supple. No tracheal deviation present.  Cardiovascular: Normal rate, regular rhythm, normal heart sounds and intact distal pulses.  Pulmonary/Chest: Effort normal and breath sounds normal. No respiratory distress.  Abdominal: Soft. Normal appearance and bowel sounds are normal. She exhibits no distension. There is no tenderness.  Musculoskeletal: no edema and no tenderness.  Neurological: rousable. No cranial nerve deficit.  Neg Brudzinski/Kernig's    Data Reviewed: Basic Metabolic Panel:  Lab 07/12/12 4540 07/09/12 0534 07/08/12 2000  NA 139 133* 134*  K 3.9 4.1 3.5  CL 100 98 98  CO2 27 29 28   GLUCOSE 110* 93 96  BUN 16 9 10   CREATININE 0.59 0.64 0.53  CALCIUM 9.6 9.2 9.2    MG -- -- --  PHOS -- -- --    Liver Function Tests:  Lab 07/12/12 0520 07/09/12 0534 07/08/12 2000  AST 15 92* 153*  ALT 86* 214* 272*  ALKPHOS 105 106 110  BILITOT 0.4 0.8 0.7  PROT 7.2 6.1 6.5  ALBUMIN 3.4* 3.2* 3.4*    Lab 07/08/12 2000  LIPASE 30  AMYLASE --    Lab 07/08/12 2237  AMMONIA 28    CBC:  Lab 07/11/12 1700 07/09/12 0534 07/08/12 2000  WBC 5.2 12.9* 13.2*  NEUTROABS 3.2 -- 10.7*  HGB 13.8 12.0* 12.2*  HCT 39.6 34.4* 34.7*  MCV 90.8 90.8 90.8  PLT 277 202 211    Cardiac Enzymes: No results found for this basename: CKTOTAL:5,CKMB:5,CKMBINDEX:5,TROPONINI:5 in the last 168 hours BNP (last 3 results) No results found for this basename: PROBNP:3 in the last 8760 hours   CBG:  Lab 07/10/12 0800  GLUCAP 98    No results found for this or any previous visit (from the past 240 hour(s)).   Studies: Dg Chest 2 View  07/08/2012  *RADIOLOGY REPORT*  Clinical Data: Cough and wheezing  CHEST - 2 VIEW  Comparison: 06/28/2012  Findings: Heart size is normal.  No pleural effusion or edema.  No airspace consolidation identified.  The visualized osseous structures are unremarkable.  IMPRESSION:  1.  No acute cardiopulmonary abnormalities.   Original Report Authenticated By: Rosealee Albee, M.D.    Dg Chest 2 View  07/01/2012  *RADIOLOGY REPORT*  Clinical Data: Cough, smoker  CHEST - 2 VIEW  Comparison: 06/23/2012  Findings: Normal heart size, mediastinal contours, and pulmonary vascularity. Lungs appear minimally hyperexpanded with mild chronic peribronchial thickening. Probable bilateral nipple shadows, at least one of which is visible on the lateral view. No acute infiltrate, pleural effusion, or pneumothorax. No acute osseous findings.  IMPRESSION: Probable bilateral nipple shadows. Mild chronic bronchitic changes without acute infiltrate.   Original Report Authenticated By: Lollie Marrow, M.D.    Ct Head Wo Contrast  06/26/2012  *RADIOLOGY REPORT*  Clinical  Data: Altered mental status.  CT HEAD WITHOUT CONTRAST  Technique:  Contiguous axial images were obtained from the base of the skull through the vertex without contrast.  Comparison: MRI 06/24/2012  Findings: No acute intracranial abnormality.  Specifically, no hemorrhage, hydrocephalus, mass lesion, acute infarction, or significant intracranial injury.  No acute calvarial abnormality. Visualized paranasal sinuses and mastoids clear.  Orbital soft tissues unremarkable.  IMPRESSION: No acute intracranial abnormality.   Original Report Authenticated By: Cyndie Chime, M.D.    Mr Brain Wo Contrast  07/08/2012  *RADIOLOGY REPORT*  Clinical Data: History of chronic alcoholism presenting with altered mental  status for 3 weeks.  Cough.  Noncooperative and combative.  MRI HEAD WITHOUT CONTRAST  Technique:  Multiplanar, multiecho pulse sequences of the brain and surrounding structures were obtained according to standard protocol without intravenous contrast.  Comparison: CT head 06/26/2012  Findings: The patient is extremely combative and uncooperative during scanning despite administration of that of an.  Despite multiple attempts, only axial diffusion weighted and ADC images were obtained.  The patient was not able to tolerate additional imaging and no additional images were obtained.  There is no evidence of any focal restricted diffusion to suggest acute infarct.  Visualized ventricles and sulci appear symmetrical without mass effect or midline shift.  Small focal extra-axial fluid collection is noted in the anterior middle cranial fossa consistent with a small arachnoid cyst.  IMPRESSION: Technically limited study due to uncooperative patient.  Only diffusion weighted images obtained.  No evidence of restricted diffusion to suggest acute infarct.   Original Report Authenticated By: Marlon Pel, M.D.     Scheduled Meds:    . aspirin  325 mg Oral Daily  . escitalopram  10 mg Oral Daily  . feeding  supplement  237 mL Oral BID BM  . heparin  5,000 Units Subcutaneous Q8H  . lactulose  10 g Oral Daily  . lactulose  300 mL Rectal Once  . LORazepam  1 mg Intravenous Once  . pantoprazole  40 mg Oral Daily   Continuous Infusions:    . dextrose 5 % and 0.9% NaCl 50 mL/hr at 07/12/12 1559    Active Problems:  Depression  Catatonia    Time spent: 20 minutes   Mahala Menghini [facility]  [facility] Pager [phone]. If 8PM-8AM, please contact night-coverage at [email or web address], password [facility] 07/12/2012, 8:04 PM  LOS: 4 days

## 2012-07-12 NOTE — Progress Notes (Signed)
Attempted to give meds crushed with ice cream.  Sat pt straight up in the bed.  Pt did not swallow meds. Two bites placed in mouth fell out of mouth.  Pt did attempt to suck ensure through straw and then he drooled out of his mouth what he sucked up with straw.

## 2012-07-12 NOTE — Progress Notes (Addendum)
+++  [facility] having technical issues.  Unable to complete psych Ax in [facility].  Attempted to assess Pt.  Pt fumbles with the tray and is unable to answer questions.  Pt mumbles slightly.  CSW contacted Pt's sister, Malon Kindle.  Mrs. Rubye Oaks stated that the family has had a lot of death in the past 7 years, including Pt's brother, step-brother, father and mother, with the most recent death being that of Pt's mother in Dec 2012.  Mrs. Rubye Oaks admits that the family has been "pulled apart" since their mother's death; siblings have stopped talking to each other.  Mrs. Rubye Oaks stated that Pt had a "normal" grieving process after his mother died and that he functioned well until approx 1 month ago.  At that time, Pt's girlfriend of 15+years stated that Pt wouldn't respond when spoken to and would stare blankly at whomever was talking.  Pt's girlfriend stated that Pt would only "sit and stare" during conversations.  Pt's family took Pt to [facility] at the beginning of Oct for these odd behaviors and he was d/c'd after 3 or so days.  Pt wasn't better and so the family decided to send Pt to [facility].  Pt was at [facility] for 3 days and his mentation significantly improved.  Pt was d/c'd into Mrs. Dickerson's care and Mrs. Rubye Oaks stated that Pt was able to perform his ADLs initially.  For approx 2 weeks, Pt was is "normal self."  The, abruptly, Pt began to lose his ability to perform and knowledge of ADLs.  Pt's sister stated that Pt was unable to walk and feed himself.  She stated that he couldn't pick up food and that he didn't know how to eat.    Pt sister stated that Pt has no psych hx, however he has mentioned that he feels depressed, at times.  Mrs. Rubye Oaks disbelieves that this could be of psych origin and asks if Pt will have to be put "in a crazy-house."  Mrs. Rubye Oaks is supportive and states that she'll help her brother in any way he needs.  CSW thanked Mrs. Rubye Oaks for her  time.  Providence Crosby, LCSWA Clinical Social Work [phone]

## 2012-07-12 NOTE — Progress Notes (Signed)
Visit the result of a referral by daytime chaplain. Mr Detwiler was found to be in low spirits. He does not feel he has the energy to carry on with his life. He is grieving the one year anniversary of his mother's death. He wishes to be with her rather than here. His grief is profound, which is resulting in his low require for getting better or making strides to connect with those around him. Grief counsel and prayer given. He dismissed the chaplain because of the fatigue he felt having to relate.  Continued follow up Chaplain consults needed.  Benjie Karvonen. Symphoni Helbling, D.Min. Chaplain

## 2012-07-13 ENCOUNTER — Encounter (HOSPITAL_COMMUNITY): Payer: Self-pay | Admitting: Internal Medicine

## 2012-07-13 DIAGNOSIS — I10 Essential (primary) hypertension: Secondary | ICD-10-CM

## 2012-07-13 DIAGNOSIS — G9340 Encephalopathy, unspecified: Secondary | ICD-10-CM | POA: Diagnosis present

## 2012-07-13 HISTORY — DX: Essential (primary) hypertension: I10

## 2012-07-13 LAB — AMMONIA: Ammonia: 33 umol/L (ref 11–60)

## 2012-07-13 NOTE — Progress Notes (Signed)
Discussed case with psych MD.  Psych MD to meet with Pt and make recommendations.  CSW to continue to follow.  Providence Crosby, LCSWA Clinical Social Work [phone]

## 2012-07-13 NOTE — Progress Notes (Signed)
[facility] PROGRESS NOTE  Karlton Maya AVW:[id number] DOB: 09-29-1955 DOA: 07/08/2012 PCP: No primary provider on file.  Assessment/Plan: Hypertension  Stable off antihypertensive medications. Follow. Transaminitis  LFTs is slowly trending down.  This shows a chronic elevation of ALT and a mild elevation of AST which is dropped slightly. I did order hepatitis panel, and INR which is normal and we will reassess the patient's labs in the morning. Patient's sister and brother state that patient does not take any Tylenol and has never had a blood transfusion so I'm unclear as to what the etiology is. This potentially could be nonalcoholic steatohepatitis? I would check complete metabolic panels every 6 months-I. suspect strongly that patient has a history of either alcohol habituation or drug ingestion. 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 Per psychiatry Encephalopathy,? Wernicke's  Lab work for encephalopathy including RPR, HIV, TSH,, drug screen, urinalysis, acute hepatitis panel, B12, TSH were all normal-total-we can get a heavy metal screen and broaden the search for the etiology-Low concern at present for infectious etiology, given his symptome improved with Ativan and he is more awake now  Patient had MRI on 07/11/2012 that showed Wernicke's encephalopathy?. HisB12 level is normal. We will attempt IM trial of vitamin B12 to see if this makes a change in mental status-if this does not, I would potentially lean towards more of a psychiatric case in terms of this being catatonia. Patient has been seen on the 19th by psychiatry and we await their other formal input and suggestions for management. Will check an ammonia level. Follow. Per psychiatry.    Code  Status: Full Family Communication: Updated patient. No family at bedside. Disposition Plan: May need inpatient psychiatry   Consultants: Neurology-McNeill Amada Jupiter, MD  Psychiatrist  Dr. Ferol Luz   Procedures: Chest x-ray 07/08/2012 = no acute cardiopulmonary abnormality  MRI 10/18 =-this was a technically limited study due to" the patient, on the diffusion-weighted images obtained is no evidence of restricted diffusion to suggest an acute infarct  Abdominal ultrasound = diminished exam detail due to overlying bowel gas, no acute findings noted  EEG performed 10/21 is pending  MRI head 10/21= slightly motion degraded exam without acute abnormality, nonspecific white matter type changes as today detailed above, potential MRI findings of Wernicke's encephalopathy     Antibiotics:  None  HPI/Subjective: Patient with no complaints. Patient tolerating oral intake. Patient is more alert today.  Objective: Filed Vitals:   07/12/12 1608 07/12/12 2145 07/13/12 0706 07/13/12 1400  BP: 124/82 123/68 116/72 115/66  Pulse: 96 85 59 80  Temp:  98 F (36.7 C) 98 F (36.7 C) 98 F (36.7 C)  TempSrc:  Oral Oral Oral  Resp: 18 18 18 18   Height:      Weight:      SpO2: 96% 97% 97% 97%    Intake/Output Summary (Last 24 hours) at 07/13/12 1751 Last data filed at 07/13/12 0627  Gross per 24 hour  Intake 1462.5 ml  Output      0 ml  Net 1462.5 ml   Filed Weights   07/09/12 0603  Weight: 58.106 kg (128 lb 1.6 oz)    Exam:   General:  NAD. CONVERSING. aLERT  Cardiovascular: RRR  Respiratory: CTAB  Abdomen: Soft/NT/ND/+BS  Data Reviewed: Basic Metabolic  Panel:  Lab 07/12/12 0520 07/09/12 0534 07/08/12 2000  NA 139 133* 134*  K 3.9 4.1 3.5  CL 100 98 98  CO2 27 29 28   GLUCOSE 110* 93 96  BUN 16 9 10   CREATININE 0.59 0.64 0.53  CALCIUM 9.6 9.2 9.2  MG -- -- --  PHOS -- -- --   Liver Function Tests:  Lab 07/12/12 0520 07/09/12 0534 07/08/12 2000  AST 15 92*  153*  ALT 86* 214* 272*  ALKPHOS 105 106 110  BILITOT 0.4 0.8 0.7  PROT 7.2 6.1 6.5  ALBUMIN 3.4* 3.2* 3.4*    Lab 07/08/12 2000  LIPASE 30  AMYLASE --    Lab 07/13/12 1330 07/08/12 2237  AMMONIA 33 28   CBC:  Lab 07/11/12 1700 07/09/12 0534 07/08/12 2000  WBC 5.2 12.9* 13.2*  NEUTROABS 3.2 -- 10.7*  HGB 13.8 12.0* 12.2*  HCT 39.6 34.4* 34.7*  MCV 90.8 90.8 90.8  PLT 277 202 211   Cardiac Enzymes: No results found for this basename: CKTOTAL:5,CKMB:5,CKMBINDEX:5,TROPONINI:5 in the last 168 hours BNP (last 3 results) No results found for this basename: PROBNP:3 in the last 8760 hours CBG:  Lab 07/10/12 0800  GLUCAP 98    No results found for this or any previous visit (from the past 240 hour(s)).   Studies: No results found.  Scheduled Meds:   . aspirin  325 mg Oral Daily  . escitalopram  10 mg Oral Daily  . feeding supplement  237 mL Oral BID BM  . heparin  5,000 Units Subcutaneous Q8H  . lactulose  10 g Oral Daily  . lactulose  300 mL Rectal Once  . LORazepam  1 mg Intravenous Once  . pantoprazole  40 mg Oral Daily   Continuous Infusions:   . dextrose 5 % and 0.9% NaCl 50 mL/hr at 07/13/12 1240    Active Problems:  Depression  Catatonia    Time spent: > 30 mins    [facility]  [facility] Pager [phone]. If 8PM-8AM, please contact night-coverage at [email or web address], password [facility] 07/13/2012, 5:51 PM  LOS: 5 days

## 2012-07-13 NOTE — Progress Notes (Signed)
Initial attempt to engage pt in consultation, pt was so decompensated with crying and grieving, that consultation ispostponed until am.   Later in the afternoon, a second attempt is made to interview this patient for the consultation. He is indisposed and history realities: His ex-wife, Cayetano Mikita [phone], his daughter Vidyuth Belsito [phone] and his brother Abrar Bilton [phone] are present. His daughter says his sister Malon Kindle is closest to this patient and stays with him her number is [phone]. She has reported to family members that he is not eating and not resting.  Discussed with Baxter Hire RN  Consultation will be pursued in am 07/13/12 Milagro Belmares J. Ferol Luz, MD Psychiatrist  07/13/2012 1:00 AM

## 2012-07-14 ENCOUNTER — Inpatient Hospital Stay (HOSPITAL_COMMUNITY): Payer: 59

## 2012-07-14 NOTE — Progress Notes (Signed)
Patient's sister called seeking information regarding patients test results and progression.  She had been here briefly but doctor unavailable to speak with her.  Gave number to Dr. Janee Morn and Dr. Ferol Luz to touch base with her.  Malon Kindle [phone]

## 2012-07-14 NOTE — Progress Notes (Signed)
Attempted to meet with Pt.  Pt unable to communicate with CSW.  Pt just stared at CSW and fumbled with his sheet.  CSW to continue to follow.  Providence Crosby, LCSWA Clinical Social Work [phone]

## 2012-07-14 NOTE — Progress Notes (Signed)
[facility] PROGRESS NOTE  Mark Ho XBM:[id number] DOB: [birth year] DOA: 07/08/2012 PCP: No primary provider on file.  Assessment/Plan: Hypertension  Stable off antihypertensive medications. Follow. Transaminitis  LFTs is slowly trending down.  This shows a chronic elevation of ALT and a mild elevation of AST which is dropped slightly. I did order hepatitis panel, and INR which is normal and we will reassess the patient's labs in the morning. Patient's sister and brother state that patient does not take any Tylenol and has never had a blood transfusion so I'm unclear as to what the etiology is. This potentially could be nonalcoholic steatohepatitis? I would check complete metabolic panels every 6 months-I. suspect strongly that patient has a history of either alcohol habituation or drug ingestion. 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 Per psychiatry Encephalopathy,? Wernicke's  Lab work for encephalopathy including RPR, HIV, TSH,, drug screen, urinalysis, acute hepatitis panel, B12, TSH were all normal-total-we can get a heavy metal screen and broaden the search for the etiology-Low concern at present for infectious etiology, given his symptome improved with Ativan and he is more awake now  Patient had MRI on 07/11/2012 that showed Wernicke's encephalopathy?. HisB12 level is normal. We will attempt IM trial of vitamin B12 to see if this makes a change in mental status-if this does not, I would potentially lean towards more of a psychiatric case in terms of this being catatonia. Patient has been seen on the 19th by psychiatry and we await their other formal input and suggestions for management. Ammonia level WNL. Follow. Per psychiatry.    Code Status:  Full Family Communication: Updated patient. No family at bedside. Disposition Plan: May need inpatient psychiatry   Consultants: Neurology-McNeill Amada Jupiter, MD  Psychiatrist  Dr. Ferol Luz   Procedures: Chest x-ray 07/08/2012 = no acute cardiopulmonary abnormality  MRI 10/18 =-this was a technically limited study due to" the patient, on the diffusion-weighted images obtained is no evidence of restricted diffusion to suggest an acute infarct  Abdominal ultrasound = diminished exam detail due to overlying bowel gas, no acute findings noted  EEG performed 10/21 is negative MRI head 10/21= slightly motion degraded exam without acute abnormality, nonspecific white matter type changes as today detailed above, potential MRI findings of Wernicke's encephalopathy     Antibiotics:  None  HPI/Subjective: Patient with no complaints. Patient tolerating oral intake. Patient is more less alert today.  Objective: Filed Vitals:   07/13/12 1400 07/13/12 2107 07/14/12 0632 07/14/12 1426  BP: 115/66 135/79 152/83 125/81  Pulse: 80 70 67 71  Temp: 98 F (36.7 C) 98.4 F (36.9 C) 97.9 F (36.6 C) 99 F (37.2 C)  TempSrc: Oral Oral Oral Oral  Resp: 18 18 18 18   Height:      Weight:      SpO2: 97% 98% 98% 97%    Intake/Output Summary (Last 24 hours) at 07/14/12 1452 Last data filed at 07/14/12 1427  Gross per 24 hour  Intake    120 ml  Output   1925 ml  Net  -1805 ml   Filed Weights   07/09/12 0603  Weight: 58.106 kg (128 lb 1.6 oz)    Exam:   General:  NAD. CONVERSING. aLERT  Cardiovascular: RRR  Respiratory: CTAB  Abdomen: Soft/NT/ND/+BS  Data Reviewed:  Basic Metabolic Panel:  Lab 07/12/12 4098 07/09/12 0534 07/08/12 2000  NA 139 133* 134*  K 3.9 4.1 3.5  CL 100 98 98  CO2 27 29 28   GLUCOSE 110* 93 96  BUN 16 9 10   CREATININE 0.59 0.64 0.53  CALCIUM 9.6 9.2 9.2  MG -- -- --  PHOS -- -- --   Liver Function Tests:  Lab 07/12/12 0520 07/09/12 0534 07/08/12  2000  AST 15 92* 153*  ALT 86* 214* 272*  ALKPHOS 105 106 110  BILITOT 0.4 0.8 0.7  PROT 7.2 6.1 6.5  ALBUMIN 3.4* 3.2* 3.4*    Lab 07/08/12 2000  LIPASE 30  AMYLASE --    Lab 07/13/12 1330 07/08/12 2237  AMMONIA 33 28   CBC:  Lab 07/11/12 1700 07/09/12 0534 07/08/12 2000  WBC 5.2 12.9* 13.2*  NEUTROABS 3.2 -- 10.7*  HGB 13.8 12.0* 12.2*  HCT 39.6 34.4* 34.7*  MCV 90.8 90.8 90.8  PLT 277 202 211   Cardiac Enzymes: No results found for this basename: CKTOTAL:5,CKMB:5,CKMBINDEX:5,TROPONINI:5 in the last 168 hours BNP (last 3 results) No results found for this basename: PROBNP:3 in the last 8760 hours CBG:  Lab 07/10/12 0800  GLUCAP 98    No results found for this or any previous visit (from the past 240 hour(s)).   Studies: Dg Abd 1 View  07/14/2012  *RADIOLOGY REPORT*  Clinical Data: 56 year old male abdominal pain.  ABDOMEN - 1 VIEW  Comparison: None.  Findings: Nonobstructed bowel gas pattern.  Intermittent retained stool the colon.  Mild motion artifact.  Grossly negative lung bases.  No definite pneumoperitoneum. No acute osseous abnormality identified.  IMPRESSION: Nonobstructed bowel gas pattern.   Original Report Authenticated By: Harley Hallmark, M.D.     Scheduled Meds:    . aspirin  325 mg Oral Daily  . escitalopram  10 mg Oral Daily  . feeding supplement  237 mL Oral BID BM  . heparin  5,000 Units Subcutaneous Q8H  . lactulose  10 g Oral Daily  . lactulose  300 mL Rectal Once  . LORazepam  1 mg Intravenous Once  . pantoprazole  40 mg Oral Daily   Continuous Infusions:    . dextrose 5 % and 0.9% NaCl 50 mL/hr at 07/13/12 1240    Principal Problem:  *Encephalopathy Active Problems:  Depression  Catatonia  Transaminitis  HTN (hypertension)    Time spent: > 30 mins    [facility]  [facility] Pager [phone]. If 8PM-8AM, please contact night-coverage at [email or web address], password [facility] 07/14/2012, 2:52 PM  LOS: 6 days

## 2012-07-14 NOTE — Evaluation (Signed)
Physical Therapy Evaluation Patient Details Name: Mark Ho MRN: [id number] DOB: [birth year] Today's Date: 07/14/2012 Time: 4098-1191 PT Time Calculation (min): 15 min  PT Assessment / Plan / Recommendation Clinical Impression  56 yo male admitted with encephalopathy/AMS. Per chart, pt was Ind with mobility ~ 1 month ago. On eval, pt requires Min assist for mobility. Ambulated 50 feet with RW. No family present during eval. Recommmend HHPT with 24 hour supervision if family is able to provide this. If not, may need to consider SNF.     PT Assessment  Patient needs continued PT services    Follow Up Recommendations  Home health PT;Supervision/Assistance - 24 hour (SNF if family unable to provide 24 hour supervision/assist. )    Does the patient have the potential to tolerate intense rehabilitation      Barriers to Discharge        Equipment Recommendations  Rolling walker with 5" wheels    Recommendations for Other Services OT consult   Frequency Min 3X/week    Precautions / Restrictions Precautions Precautions: Fall Restrictions Weight Bearing Restrictions: No   Pertinent Vitals/Pain Pt denies      Mobility  Bed Mobility Bed Mobility: Supine to Sit Supine to Sit: 4: Min assist Details for Bed Mobility Assistance: Assist to encourge participation, for trunk to upright. VCs technique.  Transfers Transfers: Sit to Stand;Stand to Sit Sit to Stand: 4: Min assist;From bed;With upper extremity assist Stand to Sit: 4: Min assist;To chair/3-in-1;With armrests;With upper extremity assist Details for Transfer Assistance: VCs safety, technique,hand placement. assist to rise, stabilize, control descent.  Ambulation/Gait Ambulation/Gait Assistance: 4: Min assist Ambulation Distance (Feet): 50 Feet Assistive device: Rolling walker Ambulation/Gait Assistance Details: VCs safety, distance from RW.Assist to stabilize throughout ambulation. Intermittent increased posterior swaying  noted.  Gait Pattern: Step-through pattern;Decreased stride length    Shoulder Instructions     Exercises     PT Diagnosis: Difficulty walking;Generalized weakness;Altered mental status  PT Problem List: Decreased strength;Decreased mobility;Decreased balance;Decreased cognition;Decreased knowledge of use of DME PT Treatment Interventions: DME instruction;Gait training;Functional mobility training;Therapeutic activities;Therapeutic exercise;Balance training;Patient/family education   PT Goals Acute Rehab PT Goals PT Goal Formulation: Patient unable to participate in goal setting Time For Goal Achievement: 07/28/12 Potential to Achieve Goals: Good Pt will go Supine/Side to Sit: with supervision PT Goal: Supine/Side to Sit - Progress: Goal set today Pt will go Sit to Supine/Side: with supervision PT Goal: Sit to Supine/Side - Progress: Goal set today Pt will go Sit to Stand: with supervision PT Goal: Sit to Stand - Progress: Goal set today Pt will Ambulate: >150 feet;with supervision;with least restrictive assistive device PT Goal: Ambulate - Progress: Goal set today  Visit Information  Last PT Received On: 07/14/12 Assistance Needed: +1    Subjective Data  Subjective: "Okay" Patient Stated Goal: None stated   Prior Functioning  Home Living Additional Comments: Pt with very little verbalizations during session. No family present to provide hx. chart states that pt was independent ~ 1 month ago Prior Function Comments: Chart states pt was Ind ~ 1 month ago. No family present to provide information.  Communication Communication: Expressive difficulties (hard to understand/mumbles)    Cognition  Overall Cognitive Status: Difficult to assess Difficult to assess due to: Impaired communication Arousal/Alertness: Lethargic Orientation Level: Person Behavior During Session: [facility] for tasks performed    Extremity/Trunk Assessment Right Lower Extremity Assessment RLE  ROM/Strength/Tone: Deficits RLE ROM/Strength/Tone Deficits: Stength at least 4/5 functional activity Left Lower Extremity Assessment LLE ROM/Strength/Tone:  Deficits LLE ROM/Strength/Tone Deficits: Stength at least 4/5 functional activity   Balance    End of Session PT - End of Session Equipment Utilized During Treatment: Gait belt Activity Tolerance: Patient tolerated treatment well Patient left: in chair;with call bell/phone within reach (with psych md in room) Nurse Communication: Mobility status;Precautions  GP     Rebeca Alert [facility] 07/14/2012, 3:41 PM [phone]

## 2012-07-14 NOTE — Progress Notes (Signed)
Patient awake. Calmer than when I last visited, but seemed somewhat despondent with flat affect. He remembered me. I asked him about visiting and he said he wasn't really up to it today. We talked about his children. He said he has three daughters, one here. Will continue to follow up.  07/14/12 1400  Clinical Encounter Type  Visited With Patient  Visit Type Follow-up  Recommendations Follow up  Spiritual Encounters  Spiritual Needs Grief support;Emotional

## 2012-07-15 DIAGNOSIS — F4321 Adjustment disorder with depressed mood: Secondary | ICD-10-CM

## 2012-07-15 MED ORDER — ENSURE COMPLETE PO LIQD
237.0000 mL | Freq: Four times a day (QID) | ORAL | Status: DC
  Administered 2012-07-15 – 2012-07-19 (×13): 237 mL via ORAL

## 2012-07-15 NOTE — Progress Notes (Signed)
Spoke with Pt's sister, Mrs. Rubye Oaks.  Mrs. 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  Mrs. Rubye Oaks stated that Pt really loved this job and was devastated at the loss of this employment.  Shortly after being fired, he started working as a Tour manager.  Mrs. Rubye Oaks stated that Pt will d/c into her care and that she will care for him as long as he needs.  Mrs. Rubye Oaks is devoted to her family and states that she will always care for her family members in times of need.  CSW thanked Mrs. Rubye Oaks for her time.  CSW to continue to follow.  Providence Crosby, LCSWA Clinical Social Work [phone]

## 2012-07-15 NOTE — Progress Notes (Signed)
Patient Identification:  Mark Ho Date of Evaluation:  07/15/2012 Reason for Consult: Mental status changes; unpredictable periods of 'catatonia' cognitive lapses,   Referring Provider  Dr.  Janee Morn  History of Present Illness:Pt is  Known to be grieving over his mother's death ~ yr ago.  She was 'the epitome' of motherhood in his mind.  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  (The nitrogen levels are not monitored; he does not wear a sensor to measure exposure) His family members have been concerned especially his daughter who visits and his older sister who is in the room today [the 23rd].   Sister says that this behavior that has caused so much concern and demonstrated cognitive decompensation on his part began about 3 months ago. It is only coincidental that he began work in a chicken farm at that time.  His sister does not have any idea what happens. She said that he will be fine talking doing her activities of daily living, going to work, eating.   She states Mark Ho he has in the recent past suddenly stopped doing ordinary and expected activities. He stops talking, he sits at the table and doesn't seem to know how to eat and he is not sleeping well. His voice is low volume at best and when he falls into these phases he doesn't talk.    Today [24th] he is awake, alert and has walked with physical therapy. When he speaks his voice is barely audible he has a blunt affect but enjoys talking about how could his mother was.  He said he would do anything, give her anything to show his appreciation.  He cannot explain different moods that come over him.  Past Psychiatric History None reported  Past Medical History:     Past Medical History  Diagnosis Date  . Esophageal stricture   . Hiatal hernia   . HTN (hypertension) 07/13/2012      History reviewed. No pertinent past surgical history.  Allergies: No Known Allergies  Current Medications:    Prior to Admission medications   Medication Sig Start Date End Date Taking? Authorizing Provider  aspirin 325 MG EC tablet Take 325 mg by mouth daily.   Yes Historical Provider, MD  clonazePAM (KLONOPIN) 0.5 MG tablet Take 0.25 mg by mouth 2 (two) times daily as needed. Anxiety 06/28/12  Yes Gwyneth Sprout, MD  escitalopram (LEXAPRO) 10 MG tablet Take 10 mg by mouth daily. 06/28/12  Yes Gwyneth Sprout, MD  lisinopril (PRINIVIL,ZESTRIL) 10 MG tablet Take 10 mg by mouth daily.   Yes Historical Provider, MD  omeprazole (PRILOSEC) 20 MG capsule Take 20 mg by mouth as needed. Acid reflux   Yes Historical Provider, MD    Social History:    reports that he quit smoking about 2 months ago. He does not have any smokeless tobacco history on file. He reports that he does not drink alcohol or use illicit drugs.   Family History:    History reviewed. No pertinent family history.  Mental Status Examination/Evaluation: Objective:  Appearance: Casual and Fairly Groomed  Psychomotor Activity:  Decreased  Eye Contact::  Good  Speech:    barely audible  Volume:  Decreased  Mood:  Depressed  Affect:  Blunt and Depressed  Thought Process:  Coherent and Range of thought is extremely limited  Orientation:  Other:  Patient is exhausted he knows he we is, knows he is in the hospital unable to participate he  takes to take after physical therapy and wants to sleep   Thought Content:  Preoccupied with the goodness of his mother and grieving for her death   Suicidal Thoughts:  No  Homicidal Thoughts:  No  Judgement:  Other:  Altered but patient is unable to explain why or how  Insight:  Lacking    DIAGNOSIS:   AXIS I  sudden change in cognitive status periodic phases rule out environmental influences/effects.unresolved bereavement of mother's death   AXIS II  Deffered  AXIS III See medical notes.  AXIS IV economic problems, housing problems, other psychosocial or environmental problems, problems  related to social environment and Episodic changes in mental status  AXIS V 51-60 moderate symptoms   Assessment/Plan:  Pt is seen both 07/13/12 and 07/14/12   Discussed with Dr. Janee Morn His sister, Mark Ho is sister  [phone],  Is present 07/03/12.  She says he has been staying at her home.  She has seen him change abruptly with loss of speech, inability to know how to feed himself, inability to dress himself. These behaviors described are typical of parieto-temporal lesions termed as 'apraxia' inability to know how to perform learned actions/activiies.  It is not accurate to use this term unless actually observed.  Today, this has not been described by staff caring for him.  There is an environmental exposure concern due to the high ammonia levels in these chicken houses, especially his that do not monitor the ammonia level.  This effect begs for research to determine is there is an associated causal relationship.   There are MRI documental changes labeled' Wernicke's lesions.  This needs further clarification.  If there is neuronal loss, it would be expected that symptoms would be more consistent.Marland Kitchen   He tires easily and evaluation will be continued.    RECOMMENDATION: 1. Patient is awake and alert today, interactive briefly. 2.  More family history/observations are required.  There may be details of chronic behaviors that have not yet been described. 3. Patient denies substance abuse R/O environmental factors. 4. Pt may respond to Provigil which activates alert, activation centers.and helps depression.   Mark Ho J. Ferol Luz, MD Psychiatrist 07/15/2012   1:48 AM

## 2012-07-15 NOTE — Progress Notes (Signed)
Physical Therapy Treatment Patient Details Name: Mark Ho MRN: [id number] DOB: 1956/07/27 Today's Date: 07/15/2012 Time: 5284-1324 PT Time Calculation (min): 24 min  PT Assessment / Plan / Recommendation Comments on Treatment Session  Progressing with mobility. Continues to require RW for stability. HHPT, 24 hour supervision    Follow Up Recommendations  Home health PT;Supervision/Assistance - 24 hour     Does the patient have the potential to tolerate intense rehabilitation     Barriers to Discharge        Equipment Recommendations  Rolling walker with 5" wheels    Recommendations for Other Services    Frequency Min 3X/week   Plan Discharge plan remains appropriate    Precautions / Restrictions Precautions Precautions: Fall Restrictions Weight Bearing Restrictions: No   Pertinent Vitals/Pain No c/o    Mobility  Bed Mobility Bed Mobility: Not assessed Transfers Transfers: Sit to Stand;Stand to Sit Sit to Stand: 4: Min guard;From chair/3-in-1 Stand to Sit: 4: Min guard;To chair/3-in-1 Details for Transfer Assistance: VCs safety, hand placement.  Ambulation/Gait Ambulation/Gait Assistance: 4: Min guard Ambulation Distance (Feet): 200 Feet Assistive device: Rolling walker Ambulation/Gait Assistance Details: Slow gait speed. VCs safety, posture Gait Pattern: Decreased stride length;Decreased step length - right;Decreased step length - left;Step-through pattern    Exercises     PT Diagnosis:    PT Problem List:   PT Treatment Interventions:     PT Goals Acute Rehab PT Goals Pt will go Sit to Stand: with supervision PT Goal: Sit to Stand - Progress: Progressing toward goal Pt will Ambulate: >150 feet;with supervision;with least restrictive assistive device PT Goal: Ambulate - Progress: Progressing toward goal  Visit Information  Last PT Received On: 07/15/12 Assistance Needed: +1    Subjective Data  Subjective: "It don't matter".....(when asked if he  wants to keep walking) Patient Stated Goal: None stated   Cognition  Overall Cognitive Status: Difficult to assess Difficult to assess due to: Impaired communication Arousal/Alertness: Awake/alert Orientation Level: Person Behavior During Session: WFL for tasks performed    Balance     End of Session PT - End of Session Equipment Utilized During Treatment: Gait belt Activity Tolerance: Patient tolerated treatment well Patient left: in chair;with call bell/phone within reach   GP     Rebeca Alert [facility] 07/15/2012, 2:43 PM [phone]

## 2012-07-15 NOTE — Progress Notes (Signed)
[facility] PROGRESS NOTE  Mark Ho BJY:[id number] DOB: 11-Apr-1956 DOA: 07/08/2012 PCP: No primary provider on file.  Assessment/Plan: Hypertension  Stable off antihypertensive medications. Follow. Transaminitis  LFTs is slowly trending down.  This shows a chronic elevation of ALT and a mild elevation of AST which is dropped slightly. I did order hepatitis panel, and INR which is normal and we will reassess the patient's labs in the morning. Patient's sister and brother state that patient does not take any Tylenol and has never had a blood transfusion so I'm unclear as to what the etiology is. This potentially could be nonalcoholic steatohepatitis? I would check complete metabolic panels every 6 months-I. suspect strongly that patient has a history of either alcohol habituation or drug ingestion. 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 Per psychiatry Encephalopathy,? Wernicke's  Lab work for encephalopathy including RPR, HIV, TSH,, drug screen, urinalysis, acute hepatitis panel, B12, TSH were all normal-total-we can get a heavy metal screen and broaden the search for the etiology-Low concern at present for infectious etiology, given his symptose improved with Ativan and he is more awake now  Patient had MRI on 07/11/2012 that showed Wernicke's encephalopathy?. HisB12 level is normal. We will attempt IM trial of vitamin B12 to see if this makes a change in mental status-if this does not, I would potentially lean towards more of a psychiatric case in terms of this being catatonia. Patient has been seen on the 19th by psychiatry and they are following. Ammonia level WNL. Follow. Per psychiatry.    Code Status: Full Family Communication: Updated patient and spoke  with sister on phone. Disposition Plan: May need inpatient psychiatry vs home pending psychiatric evaluation.   Consultants: Neurology-McNeill Amada Jupiter, MD  Psychiatrist  Dr. Ferol Luz   Procedures: Chest x-ray 07/08/2012 = no acute cardiopulmonary abnormality  MRI 10/18 =-this was a technically limited study due to" the patient, on the diffusion-weighted images obtained is no evidence of restricted diffusion to suggest an acute infarct  Abdominal ultrasound = diminished exam detail due to overlying bowel gas, no acute findings noted  EEG performed 10/21 is negative MRI head 10/21= slightly motion degraded exam without acute abnormality, nonspecific white matter type changes as today detailed above, potential MRI findings of Wernicke's encephalopathy     Antibiotics:  None  HPI/Subjective: Patient with no complaints. Patient tolerating oral intake. Patient is more less alert today.  Objective: Filed Vitals:   07/14/12 1600 07/14/12 2200 07/15/12 0600 07/15/12 1300  BP: 120/76 117/70 109/67 116/80  Pulse: 83 62 60 78  Temp: 98.2 F (36.8 C) 98.6 F (37 C) 98.1 F (36.7 C) 98.8 F (37.1 C)  TempSrc: Oral Oral Oral Oral  Resp: 20 18 18 20   Height:      Weight:      SpO2: 97% 97% 97% 99%    Intake/Output Summary (Last 24 hours) at 07/15/12 1423 Last data filed at 07/15/12 1420  Gross per 24 hour  Intake    480 ml  Output   2550 ml  Net  -2070 ml   Filed Weights   07/09/12 0603  Weight: 58.106 kg (128 lb 1.6 oz)    Exam:   General:  NAD. ALERT  Cardiovascular: RRR  Respiratory: CTAB  Abdomen: Soft/NT/ND/+BS  Data Reviewed: Basic  Metabolic Panel:  Lab 07/12/12 2130 07/09/12 0534 07/08/12 2000  NA 139 133* 134*  K 3.9 4.1 3.5  CL 100 98 98  CO2 27 29 28   GLUCOSE 110* 93 96  BUN 16 9 10   CREATININE 0.59 0.64 0.53  CALCIUM 9.6 9.2 9.2  MG -- -- --  PHOS -- -- --   Liver Function Tests:  Lab 07/12/12 0520 07/09/12 0534 07/08/12 2000  AST 15  92* 153*  ALT 86* 214* 272*  ALKPHOS 105 106 110  BILITOT 0.4 0.8 0.7  PROT 7.2 6.1 6.5  ALBUMIN 3.4* 3.2* 3.4*    Lab 07/08/12 2000  LIPASE 30  AMYLASE --    Lab 07/13/12 1330 07/08/12 2237  AMMONIA 33 28   CBC:  Lab 07/11/12 1700 07/09/12 0534 07/08/12 2000  WBC 5.2 12.9* 13.2*  NEUTROABS 3.2 -- 10.7*  HGB 13.8 12.0* 12.2*  HCT 39.6 34.4* 34.7*  MCV 90.8 90.8 90.8  PLT 277 202 211   Cardiac Enzymes: No results found for this basename: CKTOTAL:5,CKMB:5,CKMBINDEX:5,TROPONINI:5 in the last 168 hours BNP (last 3 results) No results found for this basename: PROBNP:3 in the last 8760 hours CBG:  Lab 07/10/12 0800  GLUCAP 98    No results found for this or any previous visit (from the past 240 hour(s)).   Studies: Dg Abd 1 View  07/14/2012  *RADIOLOGY REPORT*  Clinical Data: 56 year old male abdominal pain.  ABDOMEN - 1 VIEW  Comparison: None.  Findings: Nonobstructed bowel gas pattern.  Intermittent retained stool the colon.  Mild motion artifact.  Grossly negative lung bases.  No definite pneumoperitoneum. No acute osseous abnormality identified.  IMPRESSION: Nonobstructed bowel gas pattern.   Original Report Authenticated By: Harley Hallmark, M.D.     Scheduled Meds:    . aspirin  325 mg Oral Daily  . escitalopram  10 mg Oral Daily  . feeding supplement  237 mL Oral BID BM  . heparin  5,000 Units Subcutaneous Q8H  . lactulose  10 g Oral Daily  . lactulose  300 mL Rectal Once  . pantoprazole  40 mg Oral Daily   Continuous Infusions:    . dextrose 5 % and 0.9% NaCl 50 mL/hr at 07/15/12 8657    Principal Problem:  *Encephalopathy Active Problems:  Depression  Catatonia  Transaminitis  HTN (hypertension)    Time spent: > 30 mins    [facility]  [facility] Pager [phone]. If 8PM-8AM, please contact night-coverage at [email or web address], password [facility] 07/15/2012, 2:23 PM  LOS: 7 days

## 2012-07-15 NOTE — Progress Notes (Signed)
Nutrition Follow-up  Intervention: Ensure Complete QID. Recommend MD consider appetite stimulant. If pt continues to eat poorly, recommend MD discuss enteral nutrition with pt and family. Will monitor.   Diet Order: Dysphagia 3, thin  - Nursing and family report pt continues to have poor PO intake, however has been drinking Ensures, 2 so far today. Assisted pt with ordering dinner.   Meds: Scheduled Meds:   . aspirin  325 mg Oral Daily  . escitalopram  10 mg Oral Daily  . feeding supplement  237 mL Oral BID BM  . heparin  5,000 Units Subcutaneous Q8H  . lactulose  10 g Oral Daily  . lactulose  300 mL Rectal Once  . pantoprazole  40 mg Oral Daily   Continuous Infusions:   . dextrose 5 % and 0.9% NaCl 50 mL/hr at 07/15/12 0527   PRN Meds:.clonazePAM, LORazepam  Labs:  CMP     Component Value Date/Time   NA 139 07/12/2012 0520   K 3.9 07/12/2012 0520   CL 100 07/12/2012 0520   CO2 27 07/12/2012 0520   GLUCOSE 110* 07/12/2012 0520   BUN 16 07/12/2012 0520   CREATININE 0.59 07/12/2012 0520   CALCIUM 9.6 07/12/2012 0520   PROT 7.2 07/12/2012 0520   ALBUMIN 3.4* 07/12/2012 0520   AST 15 07/12/2012 0520   ALT 86* 07/12/2012 0520   ALKPHOS 105 07/12/2012 0520   BILITOT 0.4 07/12/2012 0520   GFRNONAA >90 07/12/2012 0520   GFRAA >90 07/12/2012 0520     Intake/Output Summary (Last 24 hours) at 07/15/12 1657 Last data filed at 07/15/12 1420  Gross per 24 hour  Intake    480 ml  Output   1950 ml  Net  -1470 ml   Last BM - 10/24  Weight Status:  No new weights  Estimated needs:   1800-2000 calories 75-85g protein  Nutrition Dx: Inadequate oral intake - ongoing  Goal: Intake of >75% of meals/supplements - not met  Monitor:  Weights, labs, intake   Levon Hedger MS, RD, LDN [phone] Pager [phone] After Hours Pager

## 2012-07-15 NOTE — Progress Notes (Signed)
Had conversation with sister and brother about patients condition, sister still leaning towards ammonia and chicken farm work, I comforted the two of them and stressed for them to trust the treatment team, I expressed the we were doing our best to help the patient.  I expressed to them that the client seemed to have several episodes of depression and that this could be the problem for the patient, but the sister was denying everything I suggested. Patient sister was upset that no one had called her back, but I sat down with her and her brother and explained the process of the treatment team, both brother and sister left in better state than presented with.Marland KitchenMarland Kitchen

## 2012-07-16 LAB — COMPREHENSIVE METABOLIC PANEL
AST: 26 U/L (ref 0–37)
CO2: 29 mEq/L (ref 19–32)
Calcium: 9.5 mg/dL (ref 8.4–10.5)
Creatinine, Ser: 0.65 mg/dL (ref 0.50–1.35)
GFR calc Af Amer: >90 mL/min (ref >90)
GFR calc non Af Amer: >90 mL/min (ref >90)

## 2012-07-16 MED ORDER — ESCITALOPRAM OXALATE 20 MG PO TABS
20.0000 mg | ORAL_TABLET | Freq: Every day | ORAL | Status: DC
  Administered 2012-07-16 – 2012-07-19 (×4): 20 mg via ORAL
  Filled 2012-07-16 (×4): qty 1

## 2012-07-16 MED ORDER — AMPHETAMINE-DEXTROAMPHET ER 10 MG PO CP24
10.0000 mg | ORAL_CAPSULE | Freq: Every day | ORAL | Status: DC
  Administered 2012-07-16 – 2012-07-19 (×4): 10 mg via ORAL
  Filled 2012-07-16 (×4): qty 1

## 2012-07-16 NOTE — Progress Notes (Signed)
Patient Identification:  Mark Ho Date of Evaluation:  07/16/2012 Pt was seen 07/15/12 in afternoon History of Present Illness:pt had periodic altered mental status.  Family is very concerned.  Pt says he was grieving about the loss of his mother ~ 1 yr. Ago.    Past Psychiatric History:He has been dependent most recently upon his mother's care.  He extols her as 'the best mother ever'   Past Medical History:     Past Medical History  Diagnosis Date  . Esophageal stricture   . Hiatal hernia   . HTN (hypertension) 07/13/2012      History reviewed. No pertinent past surgical history.  Allergies: No Known Allergies  Current Medications:  Prior to Admission medications   Medication Sig Start Date End Date Taking? Authorizing Provider  aspirin 325 MG EC tablet Take 325 mg by mouth daily.   Yes Historical Provider, MD  clonazePAM (KLONOPIN) 0.5 MG tablet Take 0.25 mg by mouth 2 (two) times daily as needed. Anxiety 06/28/12  Yes Gwyneth Sprout, MD  escitalopram (LEXAPRO) 10 MG tablet Take 10 mg by mouth daily. 06/28/12  Yes Gwyneth Sprout, MD  lisinopril (PRINIVIL,ZESTRIL) 10 MG tablet Take 10 mg by mouth daily.   Yes Historical Provider, MD  omeprazole (PRILOSEC) 20 MG capsule Take 20 mg by mouth as needed. Acid reflux   Yes Historical Provider, MD    Social History:    reports that he quit smoking about 2 months ago. He does not have any smokeless tobacco history on file. He reports that he does not drink alcohol or use illicit drugs.   Family History:    History reviewed. No pertinent family history.  Mental Status Examination/Evaluation: Objective:  Appearance: Casual and And sitting in a chair  Psychomotor Activity:  Normal  Eye Contact::  Good  Speech:  Extremely low but responsive to questions today  Volume:  Barely audible  Mood:  Depressed and Dysphoric  Affect:  Blunt and More expression with his speech today  Thought Process:  Coherent and Relevant    Orientation:  Other:  He is oriented to person place and his daughter  Thought Content:  Patient does not express subjective thought   Suicidal Thoughts:  No  Homicidal Thoughts:  No  Judgement:  Impaired  Insight:  Lacking    DIAGNOSIS:   AXIS I   catatonia, intermittent. Unresolved grief over mother's death   AXIS II  Deffered  AXIS III See medical notes.  AXIS IV economic problems, housing problems, occupational problems, other psychosocial or environmental problems and Struggling with the death of his mother  AXIS V 51-60 moderate symptoms   Assessment/Plan: Discussed with Dr. Janee Morn Patient is awake and alert he is sitting in chair and has attempted to eat small portion of this meal. He speaks with a volume that is barely audible. It requires straining to hear what he says or ask him to repeat his words.  He demonstrates that he knows how to need his food with a fork which rules out apraxia. He says he is willing and would enjoy living at his sister's home.  His behavior as described by various family members is not congruent with catatonia as described nor is it typical of stroke in the absence of MRI findings. 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  This is the first admission with this  collective description of his various symptoms at home. The symptoms described are not up evident during this admission. It will be most helpful should these symptoms recur for him to return to in that confused state. He demonstrates today that he is more active and states that he is starting to feel better. His labs have also improved.   Preliminary mental status exam was interrupted by the arrival of his daughter and granddaughters. RECOMMENDATION:  1. Patient is showing signs of cognitive improvement and is able to express himself 2. Suggest Lexapro be increased to 20 mg  unless side effects become apparent. This would indicate the lower dose is preferred.  3.  This patient exhibits cognitive slowing and poor concentration-as well as it can be observed. Suggest a trial to improve focus and attention of Adderall XR 10-15 mg after her morning breakfast.  Note EKG needs to be within normal limits 4.  Suggest plan to have patient return to sister's home when medically stable. 5.  No further psychiatric needs unless requested. M.D. Psychiatrist signs off Mark Ho J. Ferol Luz, MD Psychiatrist  07/16/2012   1:35 AM

## 2012-07-16 NOTE — Progress Notes (Signed)
[facility] PROGRESS NOTE  Platon Arocho ZOX:[id number] DOB: Feb 11, 1956 DOA: 07/08/2012 PCP: No primary provider on file.  Assessment/Plan: Hypertension  Stable off antihypertensive medications. Follow. Transaminitis  LFTs is slowly trending down.  This shows a chronic elevation of ALT and a mild elevation of AST which is dropped slightly. I did order hepatitis panel, and INR which is normal and we will reassess the patient's labs in the morning. Patient's sister and brother state that patient does not take any Tylenol and has never had a blood transfusion so I'm unclear as to what the etiology is. This potentially could be nonalcoholic steatohepatitis? I would check complete metabolic panels every 6 months-I. suspect strongly that patient has a history of either alcohol habituation or drug ingestion. This could be of course related to Lexapro which can be adjusted by psychiatry to a less hepatotoxic SSI  Major Depression  Increase Lexapro to 20 mg, Klonopin 0.25 twice a day, Ativan 1 mg when necessary for catatonia-further recommendations to follow appreciate assistance. Per psychiatry Encephalopathy,? Wernicke's  Lab work for encephalopathy including RPR, HIV, TSH,, drug screen, urinalysis, acute hepatitis panel, B12, TSH were all normal-total-we can get a heavy metal screen and broaden the search for the etiology-Low concern at present for infectious etiology, given his symptose improved with Ativan and he is more awake now  Patient had MRI on 07/11/2012 that showed Wernicke's encephalopathy?. HisB12 level is normal. We will attempt IM trial of vitamin B12 to see if this makes a change in mental status-if this does not, I would potentially lean towards more of a psychiatric case in terms of this being catatonia. Patient has been seen on the 19th by psychiatry and they are following. Ammonia level WNL. Follow. Per psychiatry. Psychiatry has recommended increasing patient's Lexapro to 20 mg daily  and add in Adderall. Will follow.    Code Status: Full Family Communication: Updated patient. Disposition Plan: May need inpatient psychiatry vs home pending psychiatric evaluation.   Consultants: Neurology-McNeill Amada Jupiter, MD  Psychiatrist  Dr. Ferol Luz   Procedures: Chest x-ray 07/08/2012 = no acute cardiopulmonary abnormality  MRI 10/18 =-this was a technically limited study due to" the patient, on the diffusion-weighted images obtained is no evidence of restricted diffusion to suggest an acute infarct  Abdominal ultrasound = diminished exam detail due to overlying bowel gas, no acute findings noted  EEG performed 10/21 is negative MRI head 10/21= slightly motion degraded exam without acute abnormality, nonspecific white matter type changes as today detailed above, potential MRI findings of Wernicke's encephalopathy     Antibiotics:  None  HPI/Subjective: Patient with no complaints. Patient tolerating oral intake. Patient is more alert today.  Objective: Filed Vitals:   07/15/12 1300 07/15/12 2200 07/16/12 0600 07/16/12 1410  BP: 116/80 104/64 106/66 113/59  Pulse: 78 60 60 81  Temp: 98.8 F (37.1 C) 98.5 F (36.9 C) 98 F (36.7 C) 98.5 F (36.9 C)  TempSrc: Oral Oral Oral Oral  Resp: 20 18 18 20   Height:      Weight:      SpO2: 99% 97% 97% 98%    Intake/Output Summary (Last 24 hours) at 07/16/12 1612 Last data filed at 07/16/12 1500  Gross per 24 hour  Intake   2911 ml  Output   2000 ml  Net    911 ml   Filed Weights   07/09/12 0603  Weight: 58.106 kg (128 lb 1.6 oz)    Exam:   General:  NAD. ALERT  Cardiovascular:  RRR  Respiratory: CTAB  Abdomen: Soft/NT/ND/+BS  Data Reviewed: Basic Metabolic Panel:  Lab 07/16/12 1610 07/12/12 0520  NA 136 139  K 4.8 3.9  CL 101 100  CO2 29 27  GLUCOSE 97 110*  BUN 10 16  CREATININE 0.65 0.59  CALCIUM 9.5 9.6  MG -- --  PHOS -- --   Liver Function Tests:  Lab 07/16/12 0557 07/12/12 0520    AST 26 15  ALT 75* 86*  ALKPHOS 81 105  BILITOT 0.4 0.4  PROT 6.2 7.2  ALBUMIN 2.9* 3.4*   No results found for this basename: LIPASE:5,AMYLASE:5 in the last 168 hours  Lab 07/13/12 1330  AMMONIA 33   CBC:  Lab 07/11/12 1700  WBC 5.2  NEUTROABS 3.2  HGB 13.8  HCT 39.6  MCV 90.8  PLT 277   Cardiac Enzymes: No results found for this basename: CKTOTAL:5,CKMB:5,CKMBINDEX:5,TROPONINI:5 in the last 168 hours BNP (last 3 results) No results found for this basename: PROBNP:3 in the last 8760 hours CBG:  Lab 07/10/12 0800  GLUCAP 98    No results found for this or any previous visit (from the past 240 hour(s)).   Studies: No results found.  Scheduled Meds:    . amphetamine-dextroamphetamine  10 mg Oral Daily  . aspirin  325 mg Oral Daily  . escitalopram  20 mg Oral Daily  . feeding supplement  237 mL Oral QID  . heparin  5,000 Units Subcutaneous Q8H  . lactulose  10 g Oral Daily  . lactulose  300 mL Rectal Once  . pantoprazole  40 mg Oral Daily  . DISCONTD: escitalopram  10 mg Oral Daily  . DISCONTD: feeding supplement  237 mL Oral BID BM   Continuous Infusions:    . dextrose 5 % and 0.9% NaCl 50 mL/hr at 07/16/12 0235    Principal Problem:  *Encephalopathy Active Problems:  Depression  Catatonia  Transaminitis  HTN (hypertension)    Time spent: > 30 mins    [facility]  [facility] Pager [phone]. If 8PM-8AM, please contact night-coverage at [email or web address], password [facility] 07/16/2012, 4:12 PM  LOS: 8 days

## 2012-07-17 NOTE — Care Management Note (Signed)
    Page 1 of 1   07/17/2012     4:01:57 PM   CARE MANAGEMENT NOTE 07/17/2012  Patient:  Mark Ho, Mark Ho   Account Number:  [id number]  Date Initiated:  07/14/2012  Documentation initiated by:  [facility]  Subjective/Objective Assessment:   56 year old male admitted with catatonia depression.     Action/Plan:   Awaiting psych consult to help determine disposition.   Anticipated DC Date:  07/17/2012   Anticipated DC Plan:           Choice offered to / List presented to:             Status of service:   Medicare Important Message given?   (If response is "NO", the following Medicare IM given date fields will be blank) Date Medicare IM given:   Date Additional Medicare IM given:    Discharge Disposition:    Per UR Regulation:    If discussed at Long Length of Stay Meetings, dates discussed:    Comments:  10/27 15:48 debbie Raimundo Corbit rn,bsn pt to stay w sister in [place] sister's address [address], [place] [employer], ph [phone]. pt has [place] and will fax to [facility] and they will arrange [email or web address] for dc on 10/28.  07/14/12 Algernon Huxley RN BSN Awaiting psyc consult to help with d/c planning.

## 2012-07-17 NOTE — Progress Notes (Signed)
[facility] PROGRESS NOTE  Sharrieff Spratlin ZOX:[id number] DOB: August 24, 1956 DOA: 07/08/2012 PCP: No primary provider on file.  Assessment/Plan: Hypertension  Stable off antihypertensive medications. Follow. Transaminitis  LFTs is slowly trending down.  This shows a chronic elevation of ALT and a mild elevation of AST which is dropped slightly. I did order hepatitis panel, and INR which is normal and we will reassess the patient's labs in the morning. Patient's sister and brother state that patient does not take any Tylenol and has never had a blood transfusion so I'm unclear as to what the etiology is. This potentially could be nonalcoholic steatohepatitis? I would check complete metabolic panels every 6 months-I. suspect strongly that patient has a history of either alcohol habituation or drug ingestion. This could be of course related to Lexapro which can be adjusted by psychiatry to a less hepatotoxic SSI  Major Depression  Continue Lexapro 20 mg, Klonopin 0.25 twice a day, Ativan 1 mg when necessary for catatonia-further recommendations to follow appreciate assistance. Per psychiatry Encephalopathy,? Wernicke's  Significant clinical improvement with increase in Lexapro dose and addition of Adderall. Lab work for encephalopathy including RPR, HIV, TSH,, drug screen, urinalysis, acute hepatitis panel, B12, TSH were all normal-total-we can get a heavy metal screen and broaden the search for the etiology-Low concern at present for infectious etiology, given his symptose improved with Ativan and he is more awake now  Patient had MRI on 07/11/2012 that showed Wernicke's encephalopathy?. HisB12 level is normal. We will attempt IM trial of vitamin B12 to see if this makes a change in mental status-if this does not, I would potentially lean towards more of a psychiatric case in terms of this being catatonia. Patient has been seen on the 19th by psychiatry and they are following. Ammonia level WNL. Follow.  Per psychiatry.     Code Status: Full Family Communication: Updated patient. Disposition Plan: May need inpatient psychiatry vs home pending psychiatric evaluation.   Consultants: Neurology-McNeill Amada Jupiter, MD  Psychiatrist  Dr. Ferol Luz   Procedures: Chest x-ray 07/08/2012 = no acute cardiopulmonary abnormality  MRI 10/18 =-this was a technically limited study due to" the patient, on the diffusion-weighted images obtained is no evidence of restricted diffusion to suggest an acute infarct  Abdominal ultrasound = diminished exam detail due to overlying bowel gas, no acute findings noted  EEG performed 10/21 is negative MRI head 10/21= slightly motion degraded exam without acute abnormality, nonspecific white matter type changes as today detailed above, potential MRI findings of Wernicke's encephalopathy     Antibiotics:  None  HPI/Subjective: Patient with no complaints. Patient tolerating oral intake. Patient is alert today and sitting in chair watching movies.  Objective: Filed Vitals:   07/16/12 1410 07/16/12 2245 07/17/12 0656 07/17/12 1423  BP: 113/59 131/75 132/87 102/61  Pulse: 81 60 68   Temp: 98.5 F (36.9 C) 98.1 F (36.7 C) 97.8 F (36.6 C) 99.1 F (37.3 C)  TempSrc: Oral Oral Oral Oral  Resp: 20 18 20 20   Height:      Weight:      SpO2: 98% 99% 98% 98%    Intake/Output Summary (Last 24 hours) at 07/17/12 1520 Last data filed at 07/17/12 1300  Gross per 24 hour  Intake    600 ml  Output   1050 ml  Net   -450 ml   Filed Weights   07/09/12 0603  Weight: 58.106 kg (128 lb 1.6 oz)    Exam:   General:  NAD. ALERT. Sitting  in chair.  Cardiovascular: RRR  Respiratory: CTAB  Abdomen: Soft/NT/ND/+BS  Data Reviewed: Basic Metabolic Panel:  Lab 07/16/12 7829 07/12/12 0520  NA 136 139  K 4.8 3.9  CL 101 100  CO2 29 27  GLUCOSE 97 110*  BUN 10 16  CREATININE 0.65 0.59  CALCIUM 9.5 9.6  MG -- --  PHOS -- --   Liver Function  Tests:  Lab 07/16/12 0557 07/12/12 0520  AST 26 15  ALT 75* 86*  ALKPHOS 81 105  BILITOT 0.4 0.4  PROT 6.2 7.2  ALBUMIN 2.9* 3.4*   No results found for this basename: LIPASE:5,AMYLASE:5 in the last 168 hours  Lab 07/13/12 1330  AMMONIA 33   CBC:  Lab 07/11/12 1700  WBC 5.2  NEUTROABS 3.2  HGB 13.8  HCT 39.6  MCV 90.8  PLT 277   Cardiac Enzymes: No results found for this basename: CKTOTAL:5,CKMB:5,CKMBINDEX:5,TROPONINI:5 in the last 168 hours BNP (last 3 results) No results found for this basename: PROBNP:3 in the last 8760 hours CBG: No results found for this basename: GLUCAP:5 in the last 168 hours  No results found for this or any previous visit (from the past 240 hour(s)).   Studies: No results found.  Scheduled Meds:    . amphetamine-dextroamphetamine  10 mg Oral Daily  . aspirin  325 mg Oral Daily  . escitalopram  20 mg Oral Daily  . feeding supplement  237 mL Oral QID  . heparin  5,000 Units Subcutaneous Q8H  . lactulose  10 g Oral Daily  . lactulose  300 mL Rectal Once  . pantoprazole  40 mg Oral Daily   Continuous Infusions:    . dextrose 5 % and 0.9% NaCl 50 mL/hr at 07/16/12 0235    Principal Problem:  *Encephalopathy Active Problems:  Depression  Catatonia  Transaminitis  HTN (hypertension)    Time spent: > 30 mins    [facility]  [facility] Pager [phone]. If 8PM-8AM, please contact night-coverage at [email or web address], password [facility] 07/17/2012, 3:20 PM  LOS: 9 days

## 2012-07-18 MED ORDER — LORAZEPAM 2 MG/ML IJ SOLN: 0.5000 mg | Freq: Four times a day (QID) | INTRAMUSCULAR | Status: DC | PRN

## 2012-07-18 MED ORDER — LORAZEPAM 2 MG/ML IJ SOLN
1.0000 mg | Freq: Four times a day (QID) | INTRAMUSCULAR | Status: DC | PRN
  Administered 2012-07-18 – 2012-07-19 (×4): 1 mg via INTRAVENOUS
  Filled 2012-07-18 (×4): qty 1

## 2012-07-18 NOTE — Progress Notes (Signed)
[facility] PROGRESS NOTE  Mark Ho RUE:[id number] DOB: 02/03/1956 DOA: 07/08/2012 PCP: No primary provider on file.  Assessment/Plan: Hypertension  Stable off antihypertensive medications. Follow. Transaminitis  LFTs is slowly trending down.  This shows a chronic elevation of ALT and a mild elevation of AST which is dropped slightly. I did order hepatitis panel, and INR which is normal and we will reassess the patient's labs in the morning. Patient's sister and brother state that patient does not take any Tylenol and has never had a blood transfusion so I'm unclear as to what the etiology is. This potentially could be nonalcoholic steatohepatitis? I would check complete metabolic panels every 6 months-I. suspect strongly that patient has a history of either alcohol habituation or drug ingestion. This could be of course related to Lexapro which can be adjusted by psychiatry to a less hepatotoxic SSI  Major Depression  Continue Lexapro 20 mg, Klonopin 0.25 twice a day, Ativan 1 mg when necessary for catatonia-further recommendations to follow appreciate assistance. Per psychiatry Encephalopathy,? Wernicke's  Significant clinical improvement with increase in Lexapro dose and addition of Adderall. Lab work for encephalopathy including RPR, HIV, TSH,, drug screen, urinalysis, acute hepatitis panel, B12, TSH were all normal-total-we can get a heavy metal screen and broaden the search for the etiology-Low concern at present for infectious etiology, given his symptose improved with Ativan and he is more awake now  Patient had MRI on 07/11/2012 that showed Wernicke's encephalopathy?. HisB12 level is normal. We will attempt IM trial of vitamin B12 to see if this makes a change in mental status-if this does not, I would potentially lean towards more of a psychiatric case in terms of this being catatonia. Patient has been seen on the 19th by psychiatry and they are following. Ammonia level WNL. Follow.  Per psychiatry.     Code Status: Full Family Communication: Updated patient. Disposition Plan: May need inpatient psychiatry vs home pending psychiatric evaluation.   Consultants: Neurology-McNeill Amada Jupiter, MD  Psychiatrist  Dr. Ferol Luz   Procedures: Chest x-ray 07/08/2012 = no acute cardiopulmonary abnormality  MRI 10/18 =-this was a technically limited study due to" the patient, on the diffusion-weighted images obtained is no evidence of restricted diffusion to suggest an acute infarct  Abdominal ultrasound = diminished exam detail due to overlying bowel gas, no acute findings noted  EEG performed 10/21 is negative MRI head 10/21= slightly motion degraded exam without acute abnormality, nonspecific white matter type changes as today detailed above, potential MRI findings of Wernicke's encephalopathy     Antibiotics:  None  HPI/Subjective: Patient with no complaints. Patient tolerating oral intake. Patient is less alert today and sitting in chair watching movies.  Objective: Filed Vitals:   07/17/12 1423 07/17/12 2234 07/18/12 0708 07/18/12 0935  BP: 102/61 109/57 99/58 119/78  Pulse:  72 61 71  Temp: 99.1 F (37.3 C) 98.5 F (36.9 C) 98 F (36.7 C) 98.3 F (36.8 C)  TempSrc: Oral Oral Oral Oral  Resp: 20 18 18 20   Height:      Weight:      SpO2: 98% 98% 20% 97%    Intake/Output Summary (Last 24 hours) at 07/18/12 1210 Last data filed at 07/18/12 1100  Gross per 24 hour  Intake 759.17 ml  Output   2400 ml  Net -1640.83 ml   Filed Weights   07/09/12 0603  Weight: 58.106 kg (128 lb 1.6 oz)    Exam:   General:  NAD. Less alert. Sitting in chair.  Cardiovascular: RRR  Respiratory: CTAB  Abdomen: Soft/NT/ND/+BS  Data Reviewed: Basic Metabolic Panel:  Lab 07/16/12 1610 07/12/12 0520  NA 136 139  K 4.8 3.9  CL 101 100  CO2 29 27  GLUCOSE 97 110*  BUN 10 16  CREATININE 0.65 0.59  CALCIUM 9.5 9.6  MG -- --  PHOS -- --   Liver Function  Tests:  Lab 07/16/12 0557 07/12/12 0520  AST 26 15  ALT 75* 86*  ALKPHOS 81 105  BILITOT 0.4 0.4  PROT 6.2 7.2  ALBUMIN 2.9* 3.4*   No results found for this basename: LIPASE:5,AMYLASE:5 in the last 168 hours  Lab 07/13/12 1330  AMMONIA 33   CBC:  Lab 07/11/12 1700  WBC 5.2  NEUTROABS 3.2  HGB 13.8  HCT 39.6  MCV 90.8  PLT 277   Cardiac Enzymes: No results found for this basename: CKTOTAL:5,CKMB:5,CKMBINDEX:5,TROPONINI:5 in the last 168 hours BNP (last 3 results) No results found for this basename: PROBNP:3 in the last 8760 hours CBG: No results found for this basename: GLUCAP:5 in the last 168 hours  No results found for this or any previous visit (from the past 240 hour(s)).   Studies: No results found.  Scheduled Meds:    . amphetamine-dextroamphetamine  10 mg Oral Daily  . aspirin  325 mg Oral Daily  . escitalopram  20 mg Oral Daily  . feeding supplement  237 mL Oral QID  . heparin  5,000 Units Subcutaneous Q8H  . lactulose  10 g Oral Daily  . lactulose  300 mL Rectal Once  . pantoprazole  40 mg Oral Daily   Continuous Infusions:    . dextrose 5 % and 0.9% NaCl 50 mL/hr at 07/16/12 0235    Principal Problem:  *Encephalopathy Active Problems:  Depression  Catatonia  Transaminitis  HTN (hypertension)    Time spent: > 30 mins    [facility]  [facility] Pager [phone]. If 8PM-8AM, please contact night-coverage at [email or web address], password [facility] 07/18/2012, 12:10 PM  LOS: 10 days

## 2012-07-18 NOTE — Evaluation (Signed)
Occupational Therapy Evaluation Patient Details Name: Mark Ho MRN: [id number] DOB: 03/10/1956 Today's Date: 07/18/2012 Time: 4098-1191 OT Time Calculation (min): 24 min  OT Assessment / Plan / Recommendation Clinical Impression  Pt is a 55 yo male who presents with encephalopathy, catatonia, and several unresolved psychiatric issues. Skilled OT indicated to maximize independence with BADLs to supervision/min A level in prep for d/c to next venue of care. Pt will need 24/7 A at home.    OT Assessment  Patient needs continued OT Services    Follow Up Recommendations  Supervision/Assistance - 24 hour;Home health OT (If unable to obtain 24/7 then snf.)    Barriers to Discharge      Equipment Recommendations  Rolling walker with 5" wheels;3 in 1 bedside comode    Recommendations for Other Services    Frequency  Min 2X/week    Precautions / Restrictions Precautions Precautions: Fall   Pertinent Vitals/Pain Pt did not appear to be in pain or distress.    ADL  Eating/Feeding: Performed;Maximal assistance (HOH assist.) Where Assessed - Eating/Feeding: Chair Grooming: Simulated;Minimal assistance Where Assessed - Grooming: Supported sitting Upper Body Bathing: Simulated;Minimal assistance Where Assessed - Upper Body Bathing: Supported sitting Lower Body Bathing: Simulated;Maximal assistance Where Assessed - Lower Body Bathing: Supported sit to stand Upper Body Dressing: Simulated;Minimal assistance Where Assessed - Upper Body Dressing: Supported sitting Lower Body Dressing: Simulated;Maximal assistance (Pt required ~ to don/doff socks w HOH A.) Where Assessed - Lower Body Dressing: Unsupported sitting Toilet Transfer: Simulated;Moderate assistance Toilet Transfer Method: Sit to stand Toilet Transfer Equipment: Other (comment) Nurse, children's) Toileting - Clothing Manipulation and Hygiene: Simulated;Maximal assistance Where Assessed - Engineer, mining and  Hygiene: Standing Equipment Used: Rolling walker;Gait belt Transfers/Ambulation Related to ADLs: Pt ambulated with min A and RW. Pt ambulates with short, shuffling steps. Cues for posture. ADL Comments: Pt very slow to initiate movement, many times requiring HOH assist to followthrough with functional tasks.    OT Diagnosis: Generalized weakness  OT Problem List: Decreased cognition;Decreased safety awareness;Decreased activity tolerance;Impaired balance (sitting and/or standing);Decreased knowledge of use of DME or AE OT Treatment Interventions: Self-care/ADL training;Therapeutic activities;DME and/or AE instruction;Patient/family education;Balance training   OT Goals Acute Rehab OT Goals OT Goal Formulation: With patient Time For Goal Achievement: 08/01/12 Potential to Achieve Goals: Fair ADL Goals Pt Will Perform Grooming: with supervision;Standing at sink ADL Goal: Grooming - Progress: Goal set today Pt Will Transfer to Toilet: with supervision;3-in-1;Regular height toilet;Ambulation;Stand pivot transfer ADL Goal: Toilet Transfer - Progress: Goal set today Pt Will Perform Toileting - Clothing Manipulation: with supervision;Sitting on 3-in-1 or toilet;Standing ADL Goal: Toileting - Clothing Manipulation - Progress: Goal set today Pt Will Perform Toileting - Hygiene: with supervision;Sit to stand from 3-in-1/toilet ADL Goal: Toileting - Hygiene - Progress: Goal set today Additional ADL Goal #1: Pt will complete all aspects of bathing and dressing with supervision and min VCs to initiate and followthrough. ADL Goal: Additional Goal #1 - Progress: Goal set today  Visit Information  Last OT Received On: 07/18/12 Assistance Needed: +1    Subjective Data  Subjective: Pt non-verbal throughout eval. Patient Stated Goal: Pt unable to state.   Prior Functioning     Prior Function Comments: Pt non-verbal, unresponsive throughout session. Chart states pt was  independent. Communication Communication: Expressive difficulties         Vision/Perception     Cognition  Overall Cognitive Status: Difficult to assess Difficult to assess due to: Impaired communication Arousal/Alertness: Awake/alert Orientation Level: Other (  comment) (unable to assess.) Behavior During Session: Flat affect Cognition - Other Comments: Pt requires increased time to process simple commands. Does not respond to questions. Pt appears to be attempting to whisper but no sound comes out.    Extremity/Trunk Assessment Right Upper Extremity Assessment RUE ROM/Strength/Tone: [facility] for tasks assessed Left Upper Extremity Assessment LUE ROM/Strength/Tone: WFL for tasks assessed     Mobility Transfers Sit to Stand: 3: Mod assist;With upper extremity assist;From chair/3-in-1;With armrests Stand to Sit: 3: Mod assist;With upper extremity assist;To chair/3-in-1;With armrests Details for Transfer Assistance: Max cues for safety and hand placement.     Shoulder Instructions     Exercise     Balance Static Sitting Balance Static Sitting - Balance Support: No upper extremity supported;Feet supported Static Sitting - Level of Assistance: 5: Stand by assistance;4: Min assist (varied throughout session.)   End of Session OT - End of Session Equipment Utilized During Treatment: Gait belt Activity Tolerance: Patient tolerated treatment well Patient left: in chair;with call bell/phone within reach;with chair alarm set  GO     Aerik Polan A OTR/L [phone] 07/18/2012, 2:43 PM

## 2012-07-19 ENCOUNTER — Ambulatory Visit (HOSPITAL_COMMUNITY): Payer: Self-pay | Admitting: Psychiatry

## 2012-07-19 MED ORDER — ESCITALOPRAM OXALATE 20 MG PO TABS: 20.0000 mg | ORAL_TABLET | Freq: Every day | ORAL | Status: DC

## 2012-07-19 MED ORDER — CLONAZEPAM 0.5 MG PO TABS: 0.2500 mg | ORAL_TABLET | Freq: Two times a day (BID) | ORAL | Status: DC

## 2012-07-19 MED ORDER — LORAZEPAM 1 MG PO TABS: 1.0000 mg | ORAL_TABLET | Freq: Three times a day (TID) | ORAL | Status: DC | PRN

## 2012-07-19 MED ORDER — ENSURE COMPLETE PO LIQD: 237.0000 mL | Freq: Four times a day (QID) | ORAL | Status: DC

## 2012-07-19 MED ORDER — AMPHETAMINE-DEXTROAMPHET ER 10 MG PO CP24: 10.0000 mg | ORAL_CAPSULE | Freq: Every day | ORAL | Status: DC

## 2012-07-19 NOTE — Progress Notes (Signed)
Discharge summary sent to payer through [facility]  

## 2012-07-19 NOTE — Progress Notes (Signed)
Pt's sister did not want to go over MyChart Access b/c they no access to a computer.

## 2012-07-19 NOTE — Progress Notes (Signed)
Occupational Therapy Treatment Patient Details Name: Mark Ho MRN: [id number] DOB: Jul 31, 1956 Today's Date: 07/19/2012 Time: 0865-7846 OT Time Calculation (min): 31 min  OT Assessment / Plan / Recommendation Comments on Treatment Session      Follow Up Recommendations  Supervision/Assistance - 24 hour;Home health OT    Barriers to Discharge       Equipment Recommendations  Rolling walker with 5" wheels;3 in 1 bedside comode    Recommendations for Other Services    Frequency Min 2X/week   Plan      Precautions / Restrictions Precautions Precautions: Fall Restrictions Weight Bearing Restrictions: No   Pertinent Vitals/Pain No c/o pain    ADL  Grooming: Performed;Brushing hair;Minimal assistance Where Assessed - Grooming: Supported sitting;Supported standing Toilet Transfer: Performed;Minimal Dentist Method: Sit to Barista: Raised toilet seat with arms (or 3-in-1 over toilet) Toileting - Clothing Manipulation and Hygiene: Performed;Set up;Supervision/safety Where Assessed - Toileting Clothing Manipulation and Hygiene: Sit to stand from 3-in-1 or toilet Transfers/Ambulation Related to ADLs: Pt ambulates very slowly ADL Comments: Lost balance standing at sink and required assistance to right self.  Min A needed as pt could not recover balance    OT Diagnosis:    OT Problem List:   OT Treatment Interventions:     OT Goals Acute Rehab OT Goals Time For Goal Achievement: 08/01/12 ADL Goals Pt Will Perform Grooming: with supervision;Standing at sink ADL Goal: Grooming - Progress: Progressing toward goals Pt Will Transfer to Toilet: with supervision;3-in-1;Regular height toilet;Ambulation;Stand pivot transfer ADL Goal: Toilet Transfer - Progress: Progressing toward goals Pt Will Perform Toileting - Hygiene: with supervision;Sit to stand from 3-in-1/toilet ADL Goal: Toileting - Hygiene - Progress: Progressing toward  goals  Visit Information  Last OT Received On: 07/19/12 Assistance Needed: +1    Subjective Data      Prior Functioning       Cognition  Difficult to assess due to: Impaired communication Arousal/Alertness: Awake/alert Behavior During Session: [name] for tasks performed Cognition - Other Comments: pt very soft spoken.  Delayed responding to cues at times    Mobility  Shoulder Instructions Transfers Sit to Stand: 4: Min assist Details for Transfer Assistance: mod cues       Exercises      Balance     End of Session OT - End of Session Equipment Utilized During Treatment: Gait belt Activity Tolerance: Patient tolerated treatment well Patient left: in chair;with call bell/phone within reach;with chair alarm set;with family/visitor present  GO     Mark Ho Mark Ho 07/19/2012, 11:30 AM [name], OTR/L [phone] 07/19/2012

## 2012-07-19 NOTE — Discharge Summary (Signed)
Physician Discharge Summary  Randell Teare ION:[id number] DOB: 03/01/1956 DOA: 07/08/2012  PCP: No primary provider on file.  Admit date: 07/08/2012 Discharge date: 07/19/2012  Time spent: 60 minutes  Recommendations for Outpatient Follow-up:  1. Followup with psychiatry as outpatient one week post discharge for further evaluation and management 2. Followup with PCP one to 2 weeks post discharge.  Discharge Diagnoses:  Principal Problem:  *Encephalopathy Active Problems:  Depression  Catatonia  Transaminitis  HTN (hypertension)   Discharge Condition: Stable  Diet recommendation: Regular  Filed Weights   07/09/12 0603  Weight: 58.106 kg (128 lb 1.6 oz)    History of present illness:  Mark Ho is a 56 y.o. male with depression since his mothers death last October 14, 2023 who has been having worsened mental status these past 4 weeks per his family. He will have episodes lasting as long as all day of blank stare, minimal responsiveness. Nothing seems to make these worse, when he was put in the MRI tonight ativan provided dramatic improvement (essentially back to baseline for the patient).  Hospitalist service has been asked to admit the patient by neurology service for a DDx of catatonia vs frontal lobe status epilepticus   Hospital Course:   Problem #1 acute encephalopathy/catatonia Questionable etiology. Patient was admitted with altered mental status with episodes of blank stares and minimal responsiveness.  Patient underwent an MRI where he became agitated and was therefore given Ativan and on arrival back from MRI there was dramatic improvement and he was back to his baseline. Of note patient's baseline is cognitively slow. Patient unable to read or write. A psychiatric consultation was obtained and patient was seen in consultation by Dr. Amada Jupiter on 07/08/2012. It was felt per neurology that patient either had catatonia all of sounds seizures. It was felt that patient's  symptoms of waxing and waning which started shortly after the death of his mother was pointed more towards catatonia. EEG was recommended which came back negative. MRI of the head which was done had findings of Wernicke encephalopathy. Patient however with no history of alcohol abuse. Patient had no signs or symptoms of any infectious etiology either. B12, RPR, HIV, TSH, UDS, urinalysis and acute hepatitis panel were ordered done which were within normal limits. 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 Patient was also seen by psychiatry and patient was noted to also have a depressive disorder. Patient was subsequently started on Lexapro 10 mg daily and monitored. Patient was also placed on Klonopin 0.25 mg twice daily as well as Ativan 1 mg as needed for catatonia. Patient was followed and monitored. Patient had a few further episodes of his catatonia which improved remarkably with Ativan. Patient Lexapro dose was increased to 20 mg daily and Adderall added to his regimen. Patient subsequently remained in stable condition he'll be discharged home on Klonopin twice daily as well as Ativan 1 mg as needed for his catatonia. Patient is to followup with outpatient psychiatry as outpatient.  #2 Maj. depression During the hospitalization patient was seen by psychiatry. It was felt patient did have major depression. Patient was started on Lexapro 10 mg daily which was subsequently titrated up to 20 mg daily. Patient is to followup with psychiatry as outpatient.  #3 hypertension During the hospitalization patient's lisinopril was discontinued. Patient's blood pressure remained stable and patient be discharged in stable and  improved condition.  The rest of patient's chronic medical issues remained stable throughout the hospitalization patient be discharged in stable and improved  condition.  Procedures: Chest x-ray 07/08/2012 = no acute cardiopulmonary abnormality  MRI 10/18 =-this was a technically limited study due to" the patient, on the diffusion-weighted images obtained is no evidence of restricted diffusion to suggest an acute infarct  Abdominal ultrasound = diminished exam detail due to overlying bowel gas, no acute findings noted  EEG performed 10/21 is negative  MRI head 10/21= slightly motion degraded exam without acute abnormality, nonspecific white matter type changes as today detailed above, potential MRI findings of Wernicke's encephalopathy      Consultations:  PSYCHIATRY: Dr Ferol Luz   Neurology: Dr Amada Jupiter 07/08/12  Discharge Exam: Ceasar Mons Vitals:   07/18/12 0935 07/18/12 1431 07/18/12 2236 07/19/12 0611  BP: 119/78 122/70 102/63 108/62  Pulse: 71 75 72 60  Temp: 98.3 F (36.8 C) 98.5 F (36.9 C) 98.4 F (36.9 C) 98.1 F (36.7 C)  TempSrc: Oral Oral Oral Oral  Resp: 20 18 18 18   Height:      Weight:      SpO2: 97% 98% 97% 99%    General: NAD Cardiovascular: RRR Respiratory: CTAB  Discharge Instructions  Discharge Orders    Future Orders Please Complete By Expires   Diet general      Increase activity slowly      Discharge instructions      Comments:   Follow up with a psychiatrist in 1 week. Follow up with PCP in 1-2 weeks.       Medication List     As of 07/19/2012 11:29 AM    TAKE these medications         amphetamine-dextroamphetamine 10 MG 24 hr capsule   Commonly known as: ADDERALL XR   Take 1 capsule (10 mg total) by mouth daily.      aspirin 325 MG EC tablet   Take 325 mg by mouth daily.      clonazePAM 0.5 MG tablet   Commonly known as: KLONOPIN   Take 0.25 mg by mouth 2 (two) times daily as needed. Anxiety      escitalopram 20 MG tablet   Commonly known as: LEXAPRO   Take 1 tablet (20 mg total) by mouth daily.      feeding supplement Liqd   Take 237 mLs by mouth 4 (four) times daily.       lisinopril 10 MG tablet   Commonly known as: PRINIVIL,ZESTRIL   Take 10 mg by mouth daily.      LORazepam 1 MG tablet   Commonly known as: ATIVAN   Take 1 tablet (1 mg total) by mouth every 8 (eight) hours as needed for anxiety. Take as needed for catotonia.      omeprazole 20 MG capsule   Commonly known as: PRILOSEC   Take 20 mg by mouth as needed. Acid reflux           Follow-up Information    Schedule an appointment as soon as possible for a visit in 1 week to follow up. (f/u with psychiatrist in 1 week)       Please follow up. (f/u with pcp in 1-2 weeks.)           The results of significant diagnostics from this hospitalization (including imaging, microbiology, ancillary and laboratory) are listed below for reference.    Significant Diagnostic Studies: Dg Chest 2 View  07/08/2012  *RADIOLOGY REPORT*  Clinical Data: Cough and wheezing  CHEST - 2 VIEW  Comparison: 06/28/2012  Findings: Heart size is normal.  No pleural effusion or edema.  No airspace consolidation identified.  The visualized osseous structures are unremarkable.  IMPRESSION:  1.  No acute cardiopulmonary abnormalities.   Original Report Authenticated By: Rosealee Albee, M.D.    Dg Chest 2 View  07/01/2012  *RADIOLOGY REPORT*  Clinical Data: Cough, smoker  CHEST - 2 VIEW  Comparison: 06/23/2012  Findings: Normal heart size, mediastinal contours, and pulmonary vascularity. Lungs appear minimally hyperexpanded with mild chronic peribronchial thickening. Probable bilateral nipple shadows, at least one of which is visible on the lateral view. No acute infiltrate, pleural effusion, or pneumothorax. No acute osseous findings.  IMPRESSION: Probable bilateral nipple shadows. Mild chronic bronchitic changes without acute infiltrate.   Original Report Authenticated By: Lollie Marrow, M.D.    Dg Abd 1 View  07/14/2012  *RADIOLOGY REPORT*  Clinical Data: 56 year old male abdominal pain.  ABDOMEN - 1 VIEW  Comparison:  None.  Findings: Nonobstructed bowel gas pattern.  Intermittent retained stool the colon.  Mild motion artifact.  Grossly negative lung bases.  No definite pneumoperitoneum. No acute osseous abnormality identified.  IMPRESSION: Nonobstructed bowel gas pattern.   Original Report Authenticated By: Harley Hallmark, M.D.    Ct Head Wo Contrast  06/26/2012  *RADIOLOGY REPORT*  Clinical Data: Altered mental status.  CT HEAD WITHOUT CONTRAST  Technique:  Contiguous axial images were obtained from the base of the skull through the vertex without contrast.  Comparison: MRI 06/24/2012  Findings: No acute intracranial abnormality.  Specifically, no hemorrhage, hydrocephalus, mass lesion, acute infarction, or significant intracranial injury.  No acute calvarial abnormality. Visualized paranasal sinuses and mastoids clear.  Orbital soft tissues unremarkable.  IMPRESSION: No acute intracranial abnormality.   Original Report Authenticated By: Cyndie Chime, M.D.    Mr Brain Wo Contrast  07/08/2012  *RADIOLOGY REPORT*  Clinical Data: History of chronic alcoholism presenting with altered mental status for 3 weeks.  Cough.  Noncooperative and combative.  MRI HEAD WITHOUT CONTRAST  Technique:  Multiplanar, multiecho pulse sequences of the brain and surrounding structures were obtained according to standard protocol without intravenous contrast.  Comparison: CT head 06/26/2012  Findings: The patient is extremely combative and uncooperative during scanning despite administration of that of an.  Despite multiple attempts, only axial diffusion weighted and ADC images were obtained.  The patient was not able to tolerate additional imaging and no additional images were obtained.  There is no evidence of any focal restricted diffusion to suggest acute infarct.  Visualized ventricles and sulci appear symmetrical without mass effect or midline shift.  Small focal extra-axial fluid collection is noted in the anterior middle cranial fossa  consistent with a small arachnoid cyst.  IMPRESSION: Technically limited study due to uncooperative patient.  Only diffusion weighted images obtained.  No evidence of restricted diffusion to suggest acute infarct.   Original Report Authenticated By: Marlon Pel, M.D.    Mr Laqueta Jean ZO Contrast  07/11/2012  *RADIOLOGY REPORT*  Clinical Data: Questionable seizures.  History of alcohol abuse.  MRI HEAD WITHOUT AND WITH CONTRAST  Technique:  Multiplanar, multiecho pulse sequences of the brain and surrounding structures were obtained according to standard protocol without and with intravenous contrast  Contrast: 12mL MULTIHANCE GADOBENATE DIMEGLUMINE 529 MG/ML IV SOLN  Comparison: 07/08/2012 and 06/24/2012.  Findings: No acute infarct.  Prominent nonspecific white matter type changes.  It is possible this  represents result of small vessel disease which would be advanced for the patient's age.  Other causes white matter type changes such as that secondary to; vasculitis, inflammatory process, demyelinating process, migraine headaches or result of prior trauma not excluded.  MRI findings of Wernikes encephalopathy.  Anterior left middle cranial fossa prominent cerebrospinal fluid space (versus small arachnoid cyst without associate mass effect) without change.  No intracranial enhancing lesion.  No intracranial hemorrhage.  Major intracranial vascular structures are patent.  Ethmoid sinus air cell mucosal thickening/partial opacification.  IMPRESSION: Slightly motion degraded examination without acute abnormality. Once again noted are nonspecific white matter type changes as detailed above.   Original Report Authenticated By: Fuller Canada, M.D.    US Abdomen Complete  07/10/2012  *RADIOLOGY REPORT*  Clinical Data:  Elevated liver enzymes.  ABDOMINAL ULTRASOUND COMPLETE  Comparison:  None.  Findings:  Gallbladder:  No gallstones, gallbladder wall thickening, or pericholecystic fluid.  Common Bile Duct:   Within normal limits in caliber.  Liver: Diminished exam detail due to overlying bowel gas.No focal mass lesion identified.  Within normal limits in parenchymal echogenicity.  IVC:  Appears normal.  Pancreas:  Diminished exam detail due to overlying bowel gas.  Spleen:  Within normal limits in size and echotexture.  Right kidney:  Normal in size and parenchymal echogenicity.  No evidence of mass or hydronephrosis.  Left kidney:  Normal in size and parenchymal echogenicity.  No evidence of mass or hydronephrosis.  Abdominal Aorta:  No aneurysm identified.  IMPRESSION:  1.  Diminished exam detail due to overlying bowel gas.  No acute findings noted.   Original Report Authenticated By: Rosealee Albee, M.D.     Microbiology: No results found for this or any previous visit (from the past 240 hour(s)).   Labs: Basic Metabolic Panel:  Lab 07/16/12 4782  NA 136  K 4.8  CL 101  CO2 29  GLUCOSE 97  BUN 10  CREATININE 0.65  CALCIUM 9.5  MG --  PHOS --   Liver Function Tests:  Lab 07/16/12 0557  AST 26  ALT 75*  ALKPHOS 81  BILITOT 0.4  PROT 6.2  ALBUMIN 2.9*   No results found for this basename: LIPASE:5,AMYLASE:5 in the last 168 hours  Lab 07/13/12 1330  AMMONIA 33   CBC: No results found for this basename: WBC:5,NEUTROABS:5,HGB:5,HCT:5,MCV:5,PLT:5 in the last 168 hours Cardiac Enzymes: No results found for this basename: CKTOTAL:5,CKMB:5,CKMBINDEX:5,TROPONINI:5 in the last 168 hours BNP: BNP (last 3 results) No results found for this basename: PROBNP:3 in the last 8760 hours CBG: No results found for this basename: GLUCAP:5 in the last 168 hours     Signed:  THOMPSON,DANIEL  [facility] 07/19/2012, 11:29 AM

## 2012-07-19 NOTE — Progress Notes (Signed)
Provided Pt and his sister with outpt mental health information.  Pt's sister stated that Pt had an appt today with a psychiatrist, possibly Dr. Evelene Croon.  Pt to contact Dr. Evelene Croon and r/s this appt.  No further needs identified.  Pt to be d/c'd.  Providence Crosby, LCSWA Clinical Social Work [phone]

## 2012-07-26 ENCOUNTER — Ambulatory Visit (HOSPITAL_COMMUNITY): Payer: 59 | Admitting: Psychiatry

## 2012-07-26 ENCOUNTER — Encounter (HOSPITAL_COMMUNITY): Payer: Self-pay | Admitting: Psychiatry

## 2012-07-26 VITALS — Wt 137.8 lb

## 2012-07-26 DIAGNOSIS — F329 Major depressive disorder, single episode, unspecified: Secondary | ICD-10-CM

## 2012-07-26 DIAGNOSIS — F09 Unspecified mental disorder due to known physiological condition: Secondary | ICD-10-CM

## 2012-07-26 MED ORDER — ESCITALOPRAM OXALATE 20 MG PO TABS: 20.0000 mg | ORAL_TABLET | Freq: Every day | ORAL | Status: DC

## 2012-07-26 MED ORDER — LORAZEPAM 1 MG PO TABS: 1.0000 mg | ORAL_TABLET | Freq: Three times a day (TID) | ORAL | Status: DC | PRN

## 2012-07-26 MED ORDER — CLONAZEPAM 0.5 MG PO TABS: 0.2500 mg | ORAL_TABLET | Freq: Two times a day (BID) | ORAL | Status: DC
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
## 2012-07-26 NOTE — Patient Instructions (Addendum)
Take 1/2 adderall XR. Please call if you have any question.

## 2012-08-08 ENCOUNTER — Other Ambulatory Visit (HOSPITAL_COMMUNITY): Payer: Self-pay | Admitting: Psychiatry

## 2012-08-08 ENCOUNTER — Other Ambulatory Visit (HOSPITAL_COMMUNITY): Payer: Self-pay | Admitting: *Deleted

## 2012-08-08 DIAGNOSIS — F329 Major depressive disorder, single episode, unspecified: Secondary | ICD-10-CM

## 2012-08-08 MED ORDER — LORAZEPAM 1 MG PO TABS: 1.0000 mg | ORAL_TABLET | Freq: Every day | ORAL | Status: DC | PRN

## 2012-08-08 NOTE — Telephone Encounter (Signed)
Ativan, 1 mg,  Take 1 tablet daily as needed for anxiety: 10 tablets authorized by Dr.Arfeen. Daughter notified at [phone] (message left) as requested

## 2012-08-08 NOTE — Telephone Encounter (Signed)
Daughter left WU:[id number] refill of Ativan. Only has 1 pill left.Daughter said drug store was going to fax office a refill request on Friday,and request office to repond to it. Requests call when this medicine is prescribed

## 2012-08-16 ENCOUNTER — Encounter (HOSPITAL_COMMUNITY): Payer: Self-pay | Admitting: Psychiatry

## 2012-08-16 ENCOUNTER — Encounter (HOSPITAL_COMMUNITY): Payer: Self-pay | Admitting: *Deleted

## 2012-08-16 ENCOUNTER — Ambulatory Visit (INDEPENDENT_AMBULATORY_CARE_PROVIDER_SITE_OTHER): Payer: 59 | Admitting: Psychiatry

## 2012-08-16 VITALS — BP 126/72 | HR 57 | Wt 148.0 lb

## 2012-08-16 DIAGNOSIS — F09 Unspecified mental disorder due to known physiological condition: Secondary | ICD-10-CM

## 2012-08-16 DIAGNOSIS — F329 Major depressive disorder, single episode, unspecified: Secondary | ICD-10-CM

## 2012-08-16 MED ORDER — ESCITALOPRAM OXALATE 20 MG PO TABS: 20.0000 mg | ORAL_TABLET | Freq: Every day | ORAL | Status: DC

## 2012-08-16 MED ORDER — CLONAZEPAM 0.5 MG PO TABS: 0.5000 mg | ORAL_TABLET | Freq: Three times a day (TID) | ORAL | Status: DC | PRN
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
## 2012-09-27 ENCOUNTER — Ambulatory Visit (INDEPENDENT_AMBULATORY_CARE_PROVIDER_SITE_OTHER): Payer: Self-pay | Admitting: Licensed Clinical Social Worker

## 2012-09-27 ENCOUNTER — Encounter (HOSPITAL_COMMUNITY): Payer: Self-pay | Admitting: Licensed Clinical Social Worker

## 2012-09-27 DIAGNOSIS — F329 Major depressive disorder, single episode, unspecified: Secondary | ICD-10-CM

## 2012-09-27 DIAGNOSIS — F332 Major depressive disorder, recurrent severe without psychotic features: Secondary | ICD-10-CM
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
## 2012-09-30 ENCOUNTER — Encounter (HOSPITAL_COMMUNITY): Payer: Self-pay | Admitting: Psychiatry

## 2012-09-30 ENCOUNTER — Ambulatory Visit (INDEPENDENT_AMBULATORY_CARE_PROVIDER_SITE_OTHER): Payer: Self-pay | Admitting: Psychiatry

## 2012-09-30 VITALS — Wt 166.0 lb

## 2012-09-30 DIAGNOSIS — F329 Major depressive disorder, single episode, unspecified: Secondary | ICD-10-CM

## 2012-09-30 DIAGNOSIS — F09 Unspecified mental disorder due to known physiological condition: Secondary | ICD-10-CM

## 2012-09-30 MED ORDER — CLONAZEPAM 0.5 MG PO TABS: 0.5000 mg | ORAL_TABLET | Freq: Every day | ORAL | Status: DC

## 2012-09-30 MED ORDER — ESCITALOPRAM OXALATE 5 MG PO TABS: 5.0000 mg | ORAL_TABLET | Freq: Every day | ORAL | Status: DC
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
## 2012-10-14 ENCOUNTER — Encounter (HOSPITAL_COMMUNITY): Payer: Self-pay

## 2012-10-14 ENCOUNTER — Ambulatory Visit (HOSPITAL_COMMUNITY): Payer: Self-pay | Admitting: Licensed Clinical Social Worker

## 2012-10-14 ENCOUNTER — Encounter (HOSPITAL_COMMUNITY): Payer: Self-pay | Admitting: Licensed Clinical Social Worker

## 2012-10-14 NOTE — Progress Notes (Signed)
Patient ID: Mark Ho, male   DOB: [birth year], 57 y.o.   MRN: [id number] Patient was a no show/no call for today's appointment.

## 2012-11-28 ENCOUNTER — Ambulatory Visit (INDEPENDENT_AMBULATORY_CARE_PROVIDER_SITE_OTHER): Payer: Self-pay | Admitting: Psychiatry

## 2012-11-28 ENCOUNTER — Encounter (HOSPITAL_COMMUNITY): Payer: Self-pay | Admitting: Psychiatry

## 2012-11-28 DIAGNOSIS — F329 Major depressive disorder, single episode, unspecified: Secondary | ICD-10-CM

## 2012-11-28 DIAGNOSIS — F09 Unspecified mental disorder due to known physiological condition: Secondary | ICD-10-CM

## 2012-11-28 MED ORDER — ESCITALOPRAM OXALATE 5 MG PO TABS: 5.0000 mg | ORAL_TABLET | Freq: Every day | ORAL | Status: DC

## 2012-11-28 MED ORDER — CLONAZEPAM 0.5 MG PO TABS: 0.5000 mg | ORAL_TABLET | Freq: Every day | ORAL | Status: DC
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
## 2012-12-12 ENCOUNTER — Ambulatory Visit (INDEPENDENT_AMBULATORY_CARE_PROVIDER_SITE_OTHER): Payer: Self-pay | Admitting: Licensed Clinical Social Worker

## 2012-12-12 DIAGNOSIS — F332 Major depressive disorder, recurrent severe without psychotic features: Secondary | ICD-10-CM

## 2012-12-12 NOTE — Progress Notes (Signed)
   THERAPIST PROGRESS NOTE  Session Time: 8:30am-9:20am  Participation Level: Minimal  Behavioral Response: DisheveledLethargicDepressed  Type of Therapy: Family Therapy  Treatment Goals addressed: Coping  Interventions: CBT, Motivational Interviewing, Solution Focused, Strength-based, Supportive and Reframing  Summary: Mark Ho is a 57 y.o. male who presents with depressed mood and flat affect. He is accompanied by his sister today. He reports some improvement in his depression since his last visit. His sister is now consistently giving him his medication and they both endorse seeing improvement as a result of this. Patient does engage in conversation, but must be prompted. His spontaneous conversation capacity has improved.  He spends his time helping a neighbor work on odd jobs. While he is not paid for this work, he is pleased to have something to do with his time. His ability to focus has improved. He is eating and sleeping well. He does endorse sleeping during the day to avoid his stressful thoughts about his lack of money. He is not able to work at this time and is unable to pay his bills. He tried to return home, but found this too stressful. He remains at his sisters at this time.   Suicidal/Homicidal: Nowithout intent/plan  Therapist Response: Assessed patients current functioning and reviewed progress. Reviewed coping strategies. Assessed patients safety and assisted in identifying protective factors.  Reviewed crisis plan with patient. Assisted patient with the expression of frustration. Reviewed patients self care plan. Assessed progress related to self care. Patients self care is fair. Recommend daily exercise, increased socialization and recreation. Used motivational interviewing to assist and encourage patient through the change process. Explored patients barriers to change. Processed and normalized patients grief reaction. Recommend patient increase his activities, for  distraction from upsetting thoughts.   Plan: Return again in four weeks.  Diagnosis: Axis I: Major Depression, Recurrent severe    Axis II: No diagnosis    [name], LCSW 12/12/2012

## 2013-01-12 ENCOUNTER — Ambulatory Visit (INDEPENDENT_AMBULATORY_CARE_PROVIDER_SITE_OTHER): Payer: Self-pay | Admitting: Licensed Clinical Social Worker

## 2013-01-12 DIAGNOSIS — F332 Major depressive disorder, recurrent severe without psychotic features: Secondary | ICD-10-CM

## 2013-01-12 NOTE — Progress Notes (Signed)
   THERAPIST PROGRESS NOTE  Session Time: 8:30am-9:20am  Participation Level: Minimal  Behavioral Response: DisheveledDrowsy and LethargicDepressed  Type of Therapy: Family Therapy  Treatment Goals addressed: Coping  Interventions: Motivational Interviewing, Strength-based and Supportive  Summary: Mark Ho is a 57 y.o. male who presents with depressed mood and constricted affect. He is accompanied by his sister today. He appears tired and lethargic. His interactions are minimal and require prompting. His sister expresses concern that patient has been "very quiet" and sleeps a lot. He endorses going back to sleep after breakfast for a few hours. He questions if it is his medciation or if he just likes to sleep. He has helped his sister mow a few lawns, but does not really want to do this. He sits at home and watches TV all day. He endorses boredom and poor motivation. He does become engaged when he talks about his grandchildren. He is eating well. He has not returned home to his trailer because his girlfriend is still allowing someone to stay there who is abusing drugs. He wants to stay at his sisters and she is fine with this.    Suicidal/Homicidal: Nowithout intent/plan  Therapist Response: Assessed patients current functioning and reviewed progress. Reviewed coping strategies. Assessed patients safety and assisted in identifying protective factors.  Reviewed crisis plan with patient. Assisted patient with the expression of frustration with his inability to return to his home. He does not want to address this conflict Reviewed patients self care plan. Assessed progress related to self care. Patients self care is poor. Recommend daily exercise, increased socialization and recreation. Used motivational interviewing to assist and encourage patient through the change process. Explored patients barriers to change. Strongly encourage patient to spend time outside, with his grandchildren and to  assist his sister.   Plan: Return again in four weeks.  Diagnosis: Axis I: Major Depression, Recurrent severe    Axis II: No diagnosis    Evianna Chandran, LCSW 01/12/2013

## 2013-02-09 ENCOUNTER — Ambulatory Visit (INDEPENDENT_AMBULATORY_CARE_PROVIDER_SITE_OTHER): Payer: Self-pay | Admitting: Licensed Clinical Social Worker

## 2013-02-09 DIAGNOSIS — F332 Major depressive disorder, recurrent severe without psychotic features: Secondary | ICD-10-CM
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
## 2013-02-28 ENCOUNTER — Ambulatory Visit (INDEPENDENT_AMBULATORY_CARE_PROVIDER_SITE_OTHER): Payer: Self-pay | Admitting: Psychiatry

## 2013-02-28 ENCOUNTER — Encounter (HOSPITAL_COMMUNITY): Payer: Self-pay | Admitting: Psychiatry

## 2013-02-28 VITALS — BP 120/76 | HR 63 | Ht 67.5 in | Wt 164.3 lb

## 2013-02-28 DIAGNOSIS — F329 Major depressive disorder, single episode, unspecified: Secondary | ICD-10-CM

## 2013-02-28 MED ORDER — ESCITALOPRAM OXALATE 5 MG PO TABS: 5.0000 mg | ORAL_TABLET | Freq: Every day | ORAL | Status: DC

## 2013-02-28 MED ORDER — CLONAZEPAM 0.5 MG PO TABS: 0.5000 mg | ORAL_TABLET | Freq: Every day | ORAL | Status: DC
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
## 2013-04-12 ENCOUNTER — Ambulatory Visit (HOSPITAL_COMMUNITY): Payer: Self-pay | Admitting: Licensed Clinical Social Worker

## 2013-04-12 ENCOUNTER — Encounter (HOSPITAL_COMMUNITY): Payer: Self-pay | Admitting: Licensed Clinical Social Worker

## 2013-04-12 NOTE — Progress Notes (Signed)
Patient ID: Mark Ho, male   DOB: Jul 20, 1956, 57 y.o.   MRN: [id number] Patient cancelled late for today's appointment.

## 2013-05-31 ENCOUNTER — Encounter (HOSPITAL_COMMUNITY): Payer: Self-pay | Admitting: Psychiatry

## 2013-05-31 ENCOUNTER — Ambulatory Visit (INDEPENDENT_AMBULATORY_CARE_PROVIDER_SITE_OTHER): Payer: Self-pay | Admitting: Psychiatry

## 2013-05-31 VITALS — BP 125/91 | HR 83 | Ht 67.5 in | Wt 164.8 lb

## 2013-05-31 DIAGNOSIS — F329 Major depressive disorder, single episode, unspecified: Secondary | ICD-10-CM

## 2013-05-31 DIAGNOSIS — F09 Unspecified mental disorder due to known physiological condition: Secondary | ICD-10-CM

## 2013-05-31 MED ORDER — CLONAZEPAM 0.5 MG PO TABS: 0.5000 mg | ORAL_TABLET | Freq: Every day | ORAL | Status: DC

## 2013-05-31 MED ORDER — ESCITALOPRAM OXALATE 10 MG PO TABS: 10.0000 mg | ORAL_TABLET | Freq: Every day | ORAL | Status: DC
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
## 2013-07-01 IMAGING — CR DG CHEST 2V
2 series · 2 of 2 positions shown · non-contrast
Comparison: 06/28/2012

CLINICAL DATA: Cough and wheezing

CHEST - 2 VIEW

[w chest lat]
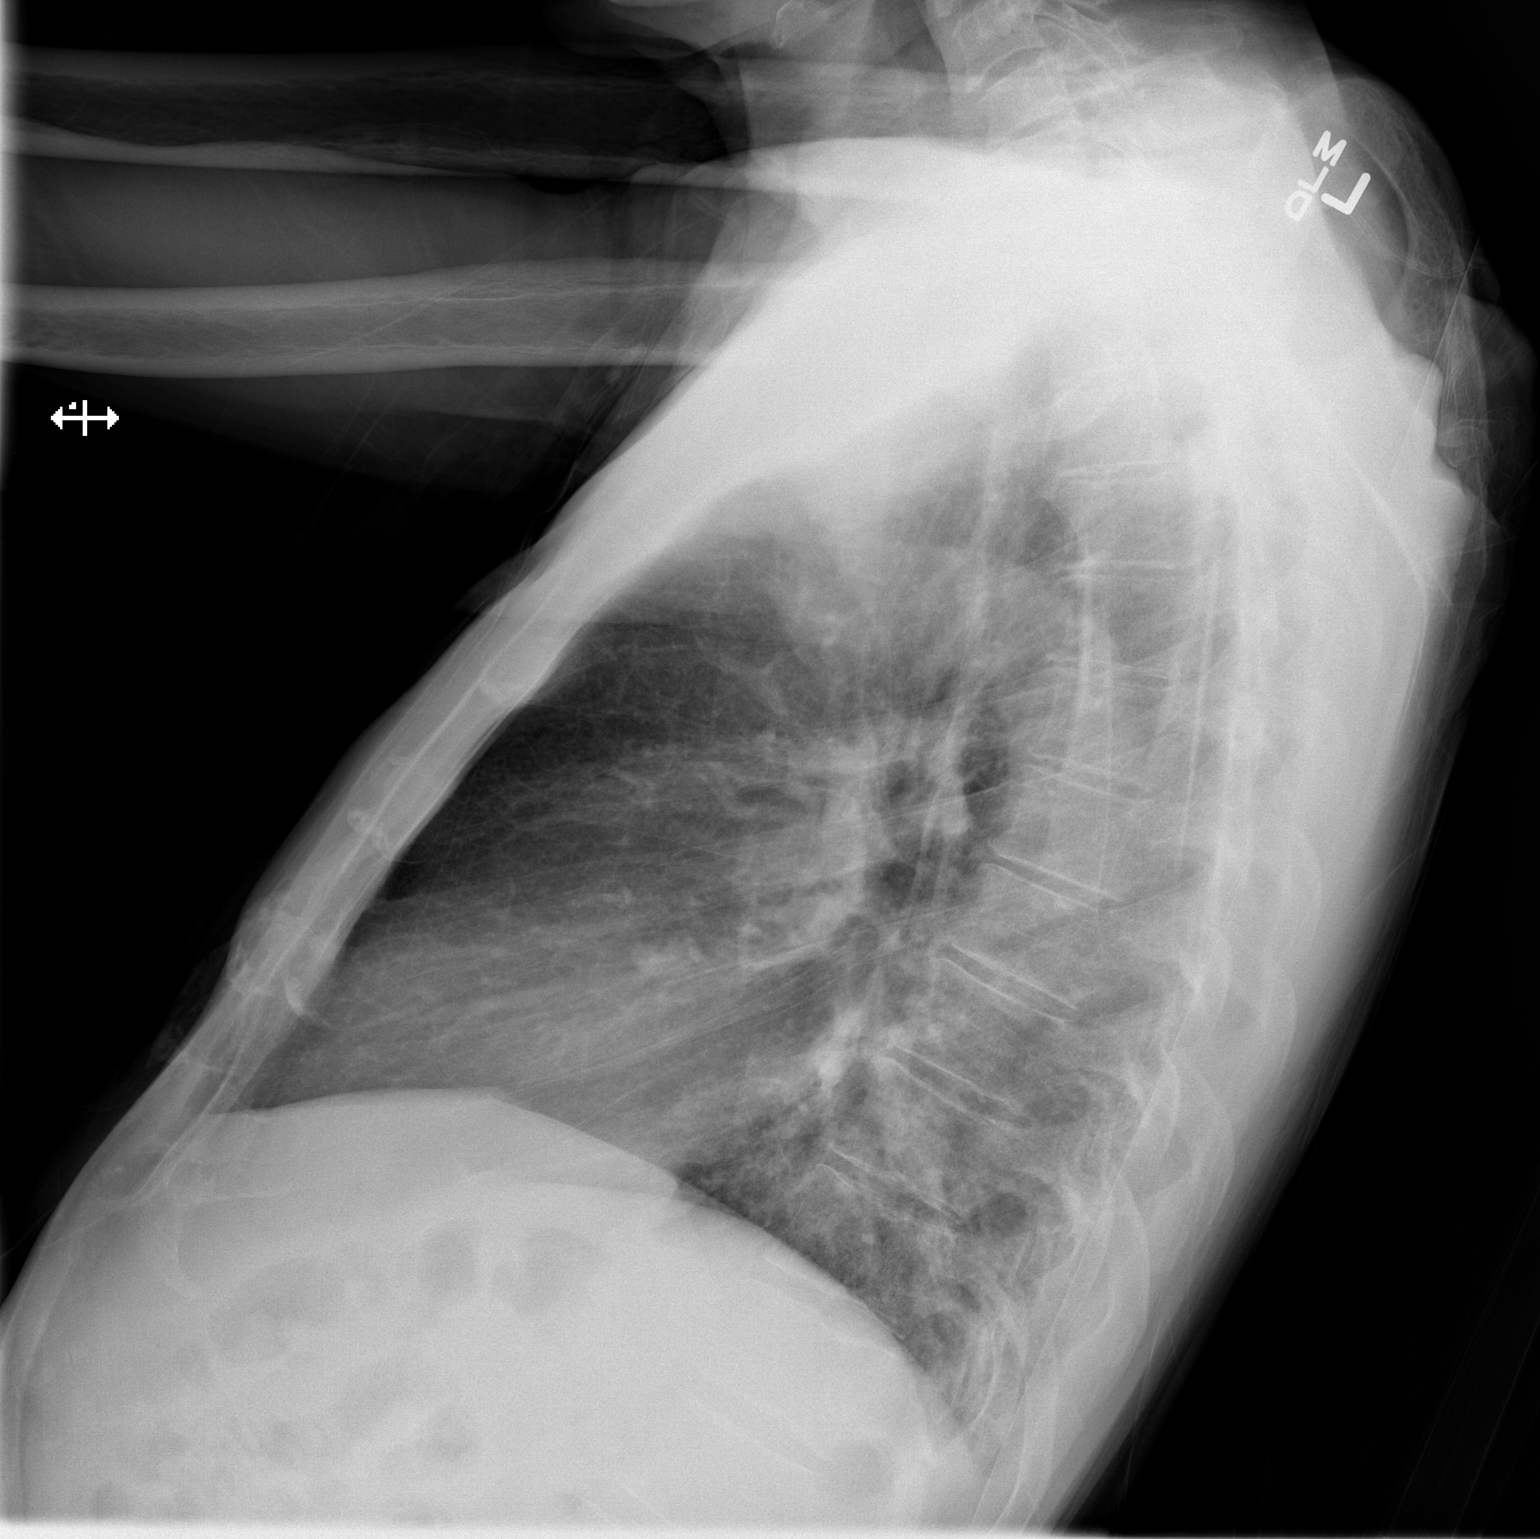

[x chest ap]
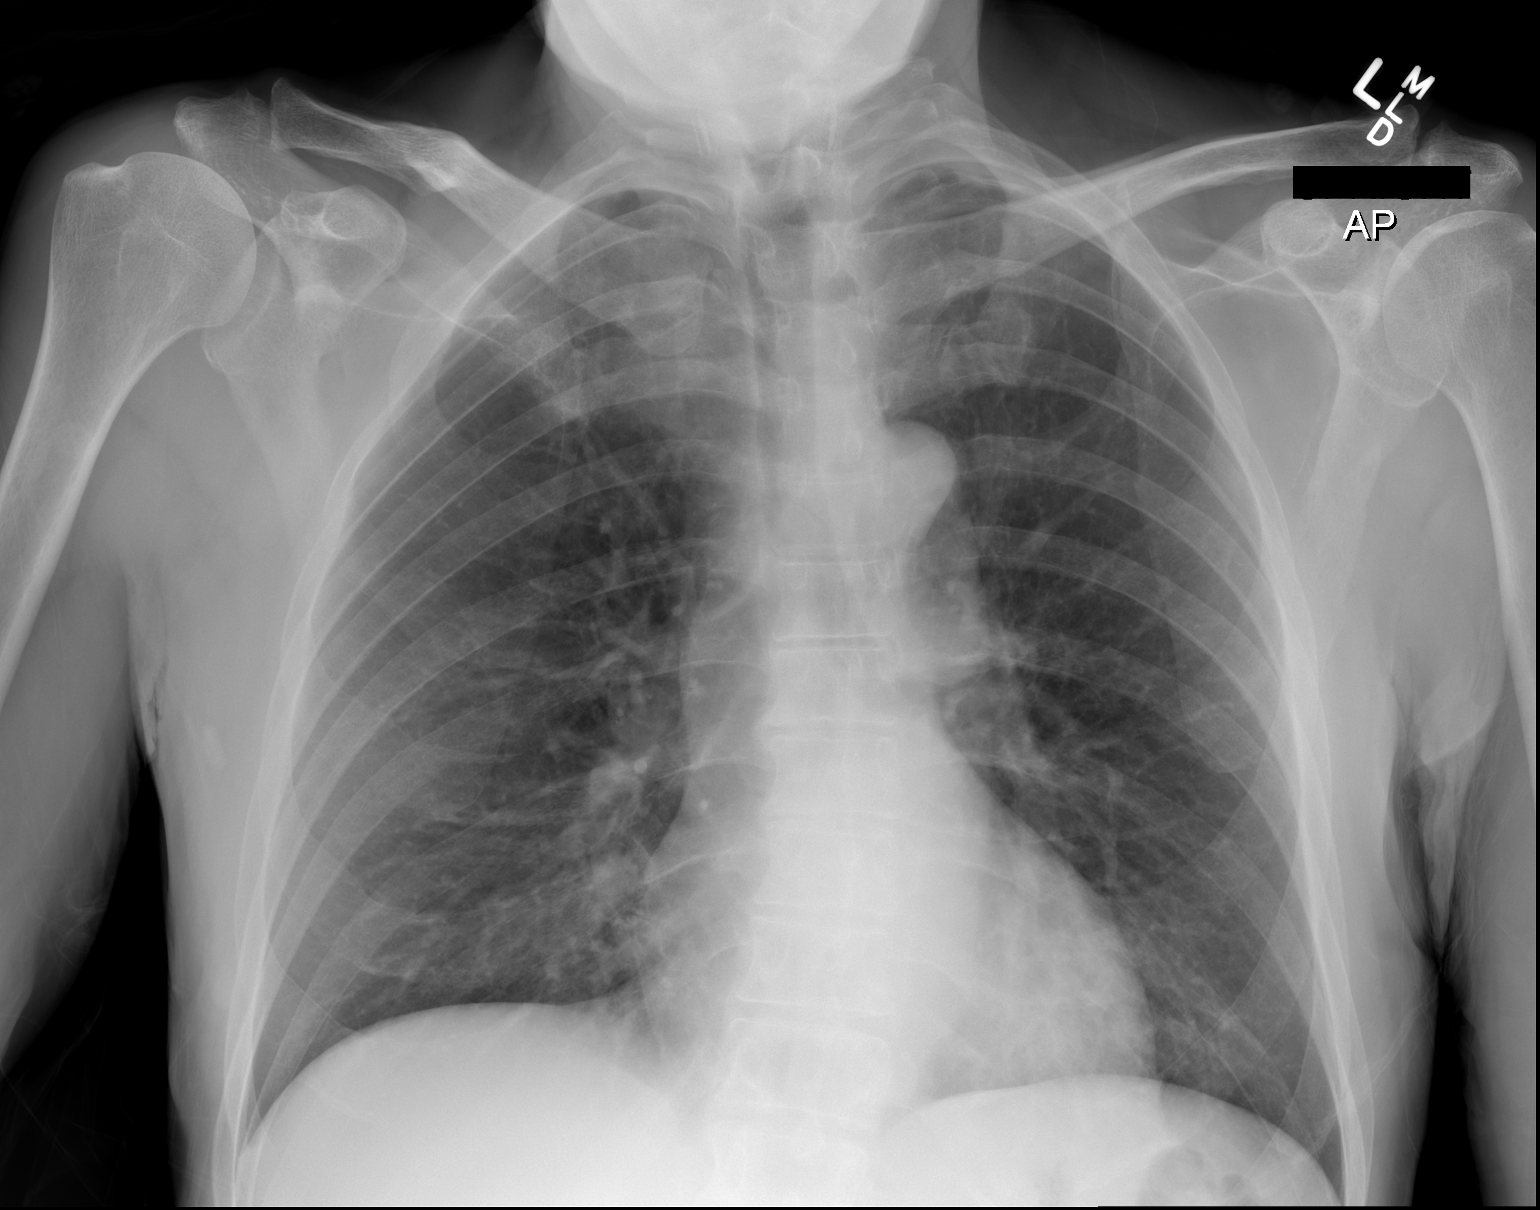

[2 of 2 positions shown; findings below may reference images not displayed]

FINDINGS: Heart size is normal.

No pleural effusion or edema.

No airspace consolidation identified.

The visualized osseous structures are unremarkable.
IMPRESSION: 1.  No acute cardiopulmonary abnormalities.

## 2013-07-03 IMAGING — US US ABDOMEN COMPLETE
1 series · 14 of 25 positions shown · non-contrast
Comparison: None.

CLINICAL DATA: Elevated liver enzymes.

ABDOMINAL ULTRASOUND COMPLETE

[Series 1: us abdomen complete · 0.30mm/px · 14 of 67 slices shown]
[im 1/67]
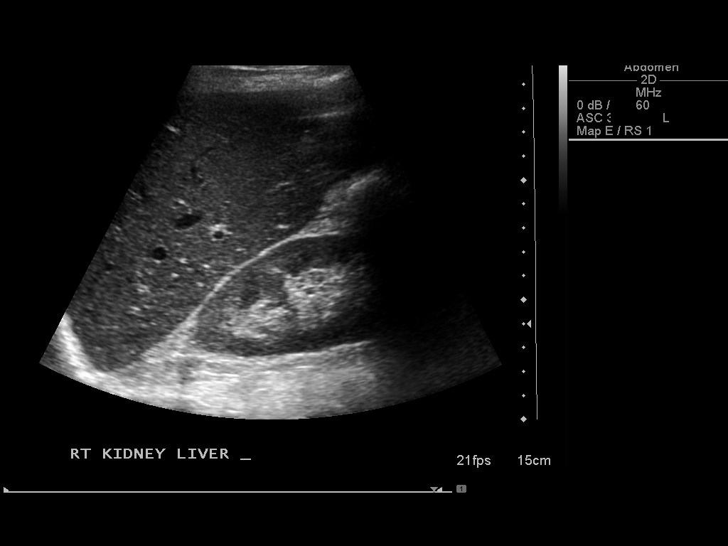
[im 6/67]
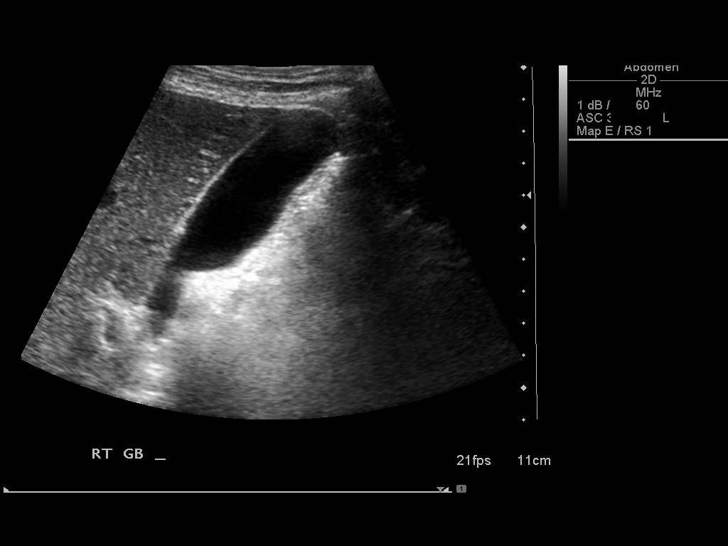
[im 12/67]
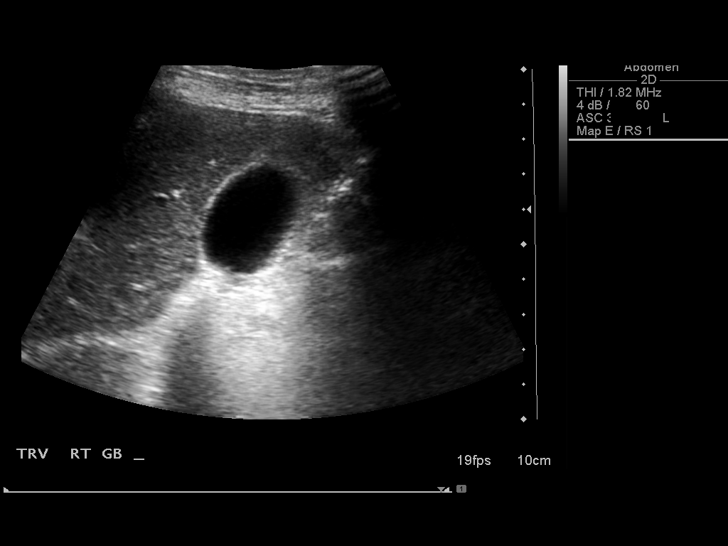
[im 17/67]
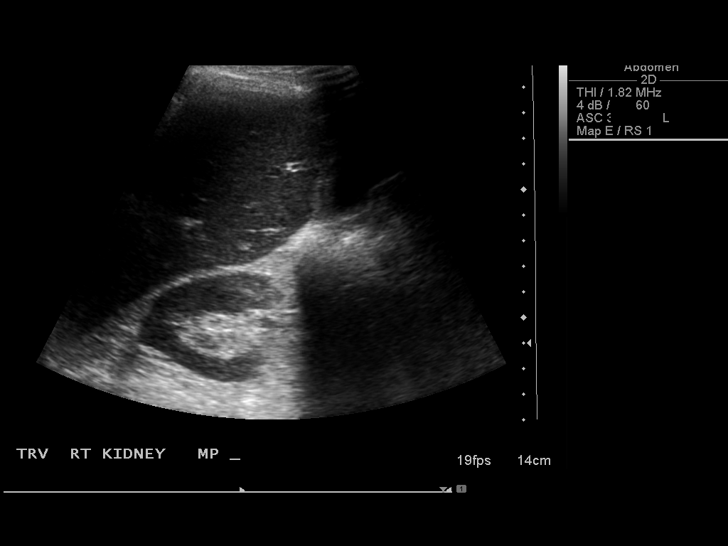
[im 23/67]
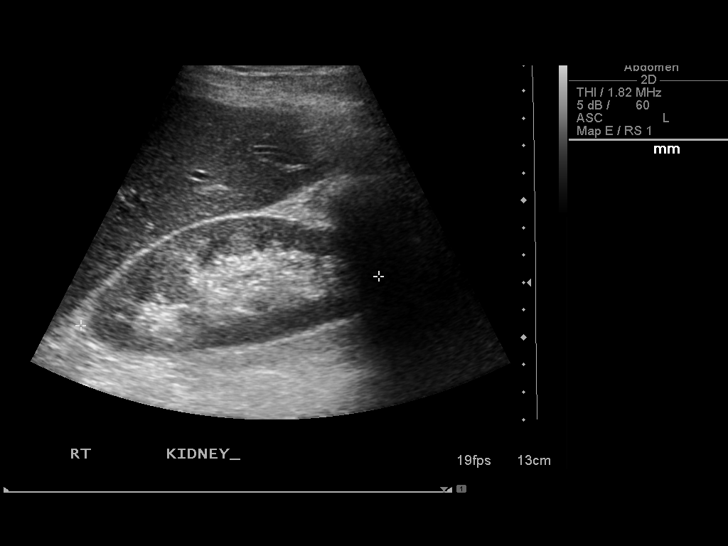
[im 25/67]
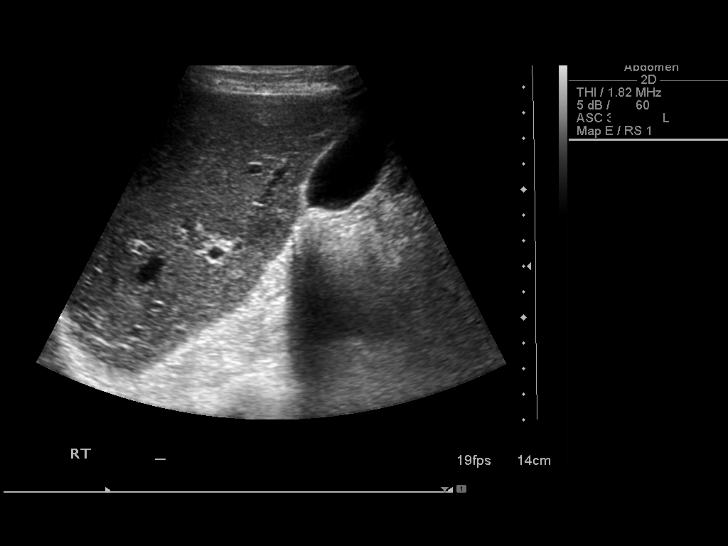
[im 31/67]
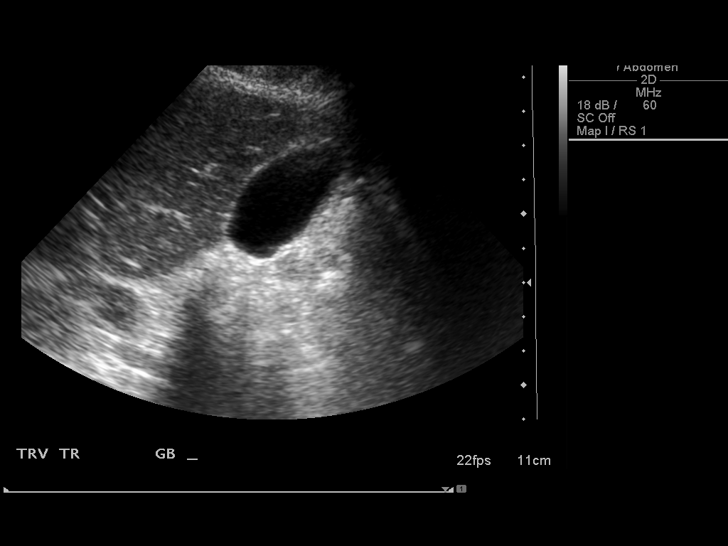
[im 36/67]
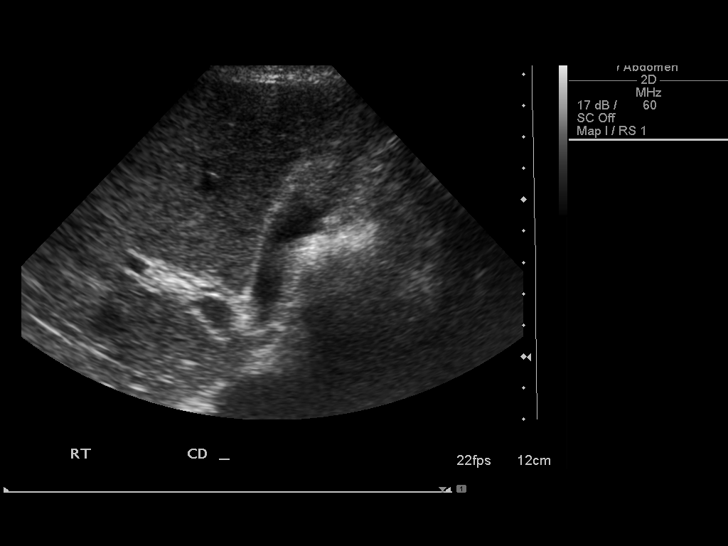
[im 42/67]
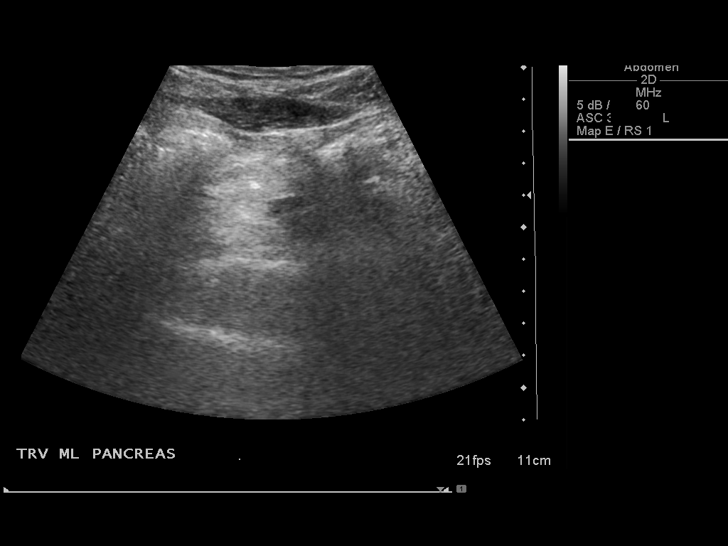
[im 45/67]
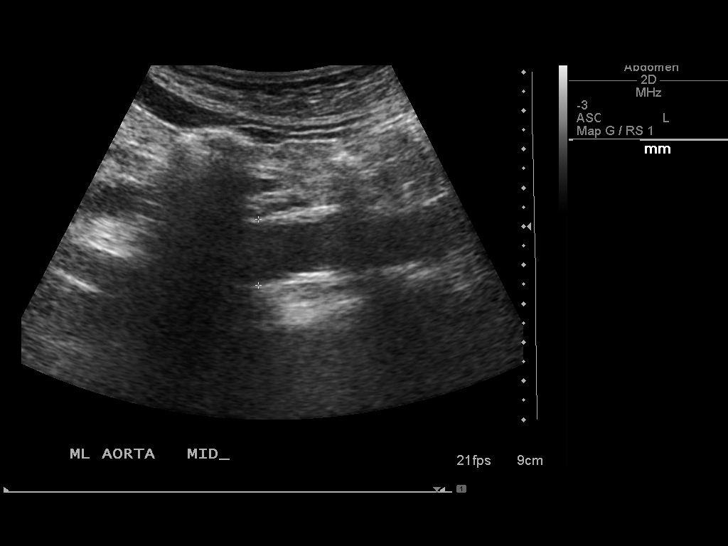
[im 50/67]
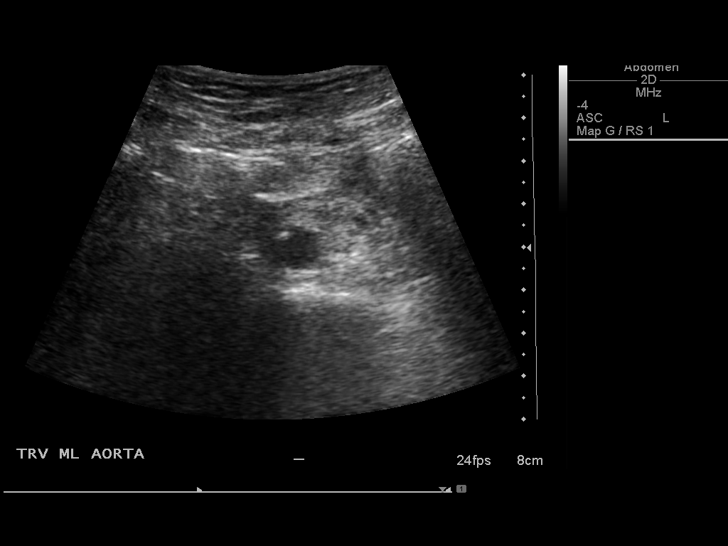
[im 56/67]
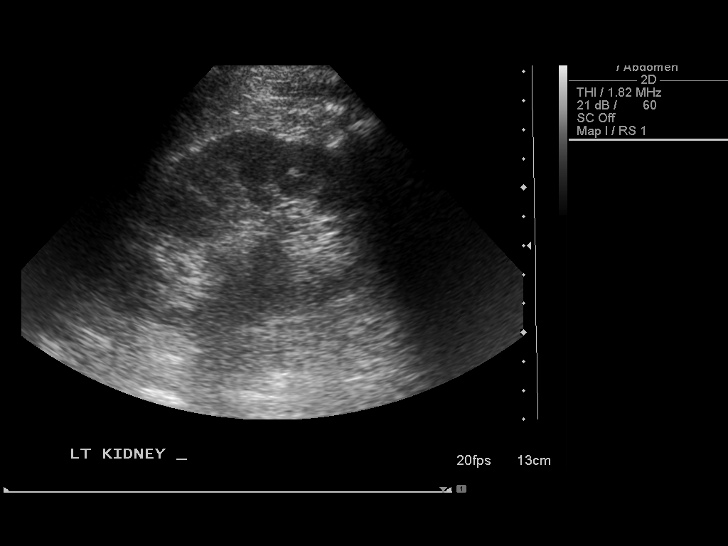
[im 61/67]
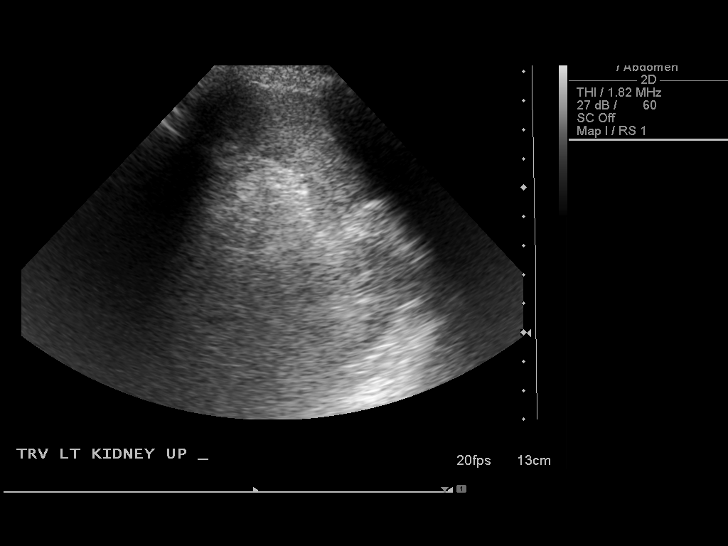
[im 67/67]
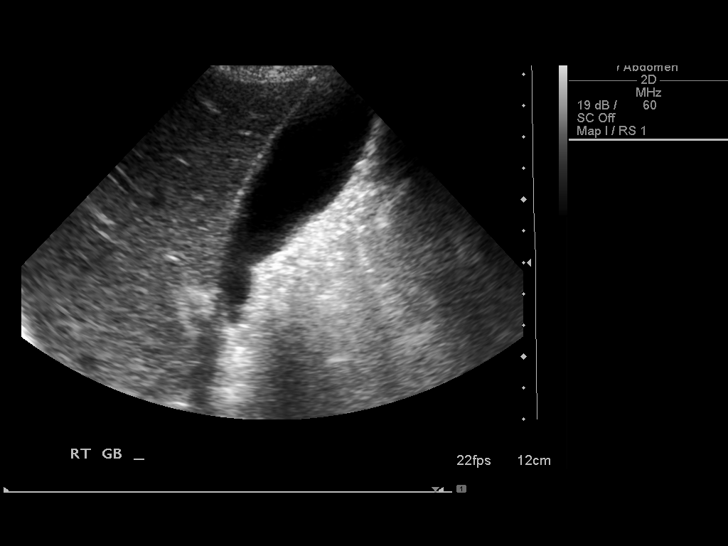

[14 of 25 positions shown; findings below may reference images not displayed]

FINDINGS: Gallbladder:  No gallstones, gallbladder wall thickening, or
pericholecystic fluid.

Common Bile Duct:  Within normal limits in caliber.

Liver: Diminished exam detail due to overlying bowel gas.No focal
mass lesion identified.  Within normal limits in parenchymal
echogenicity.

IVC:  Appears normal.

Pancreas:  Diminished exam detail due to overlying bowel gas.

Spleen:  Within normal limits in size and echotexture.

Right kidney:  Normal in size and parenchymal echogenicity.  No
evidence of mass or hydronephrosis.

Left kidney:  Normal in size and parenchymal echogenicity.  No
evidence of mass or hydronephrosis.

Abdominal Aorta:  No aneurysm identified.
IMPRESSION: 1.  Diminished exam detail due to overlying bowel gas.  No acute
findings noted.

## 2013-07-07 IMAGING — CR DG ABDOMEN 1V
1 series · 2 of 2 positions shown · non-contrast
Comparison: None.

CLINICAL DATA: 56-year-old male abdominal pain.

ABDOMEN - 1 VIEW

[Series 1: ap (kub) · U · 2 of 2 slices shown]
[im 1/2]
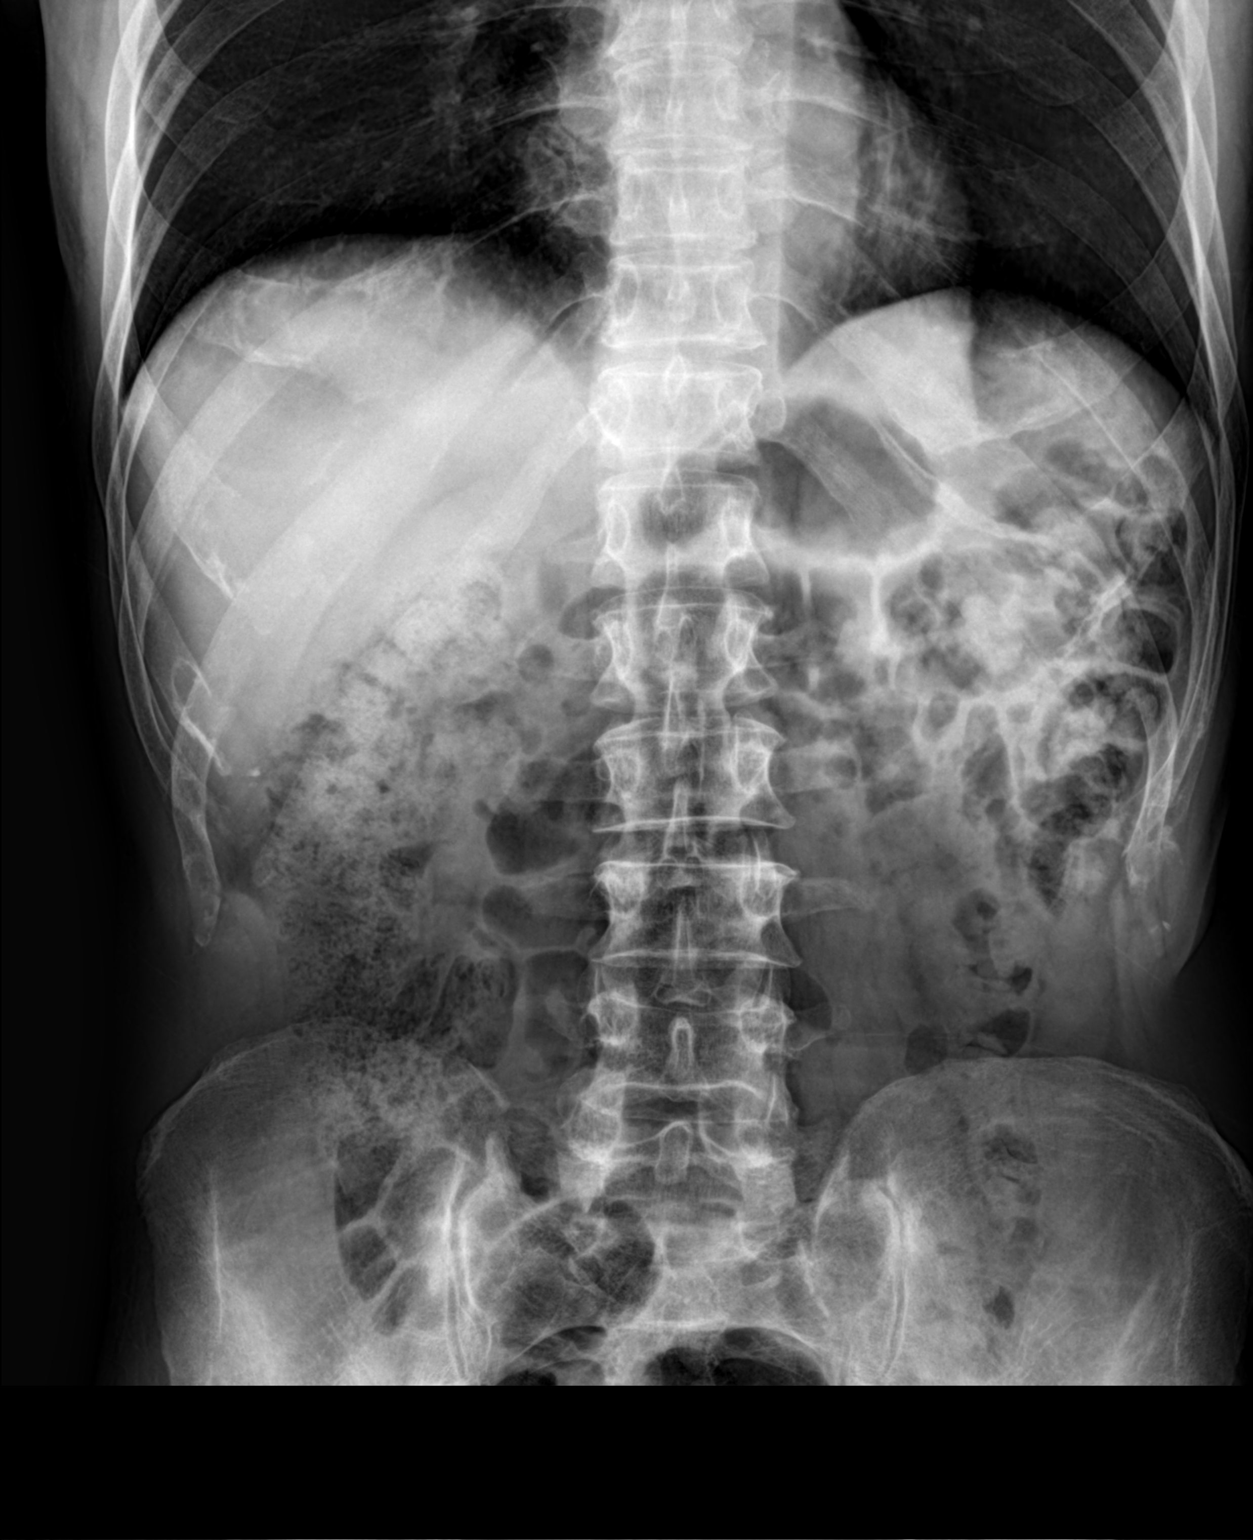
[im 2/2]
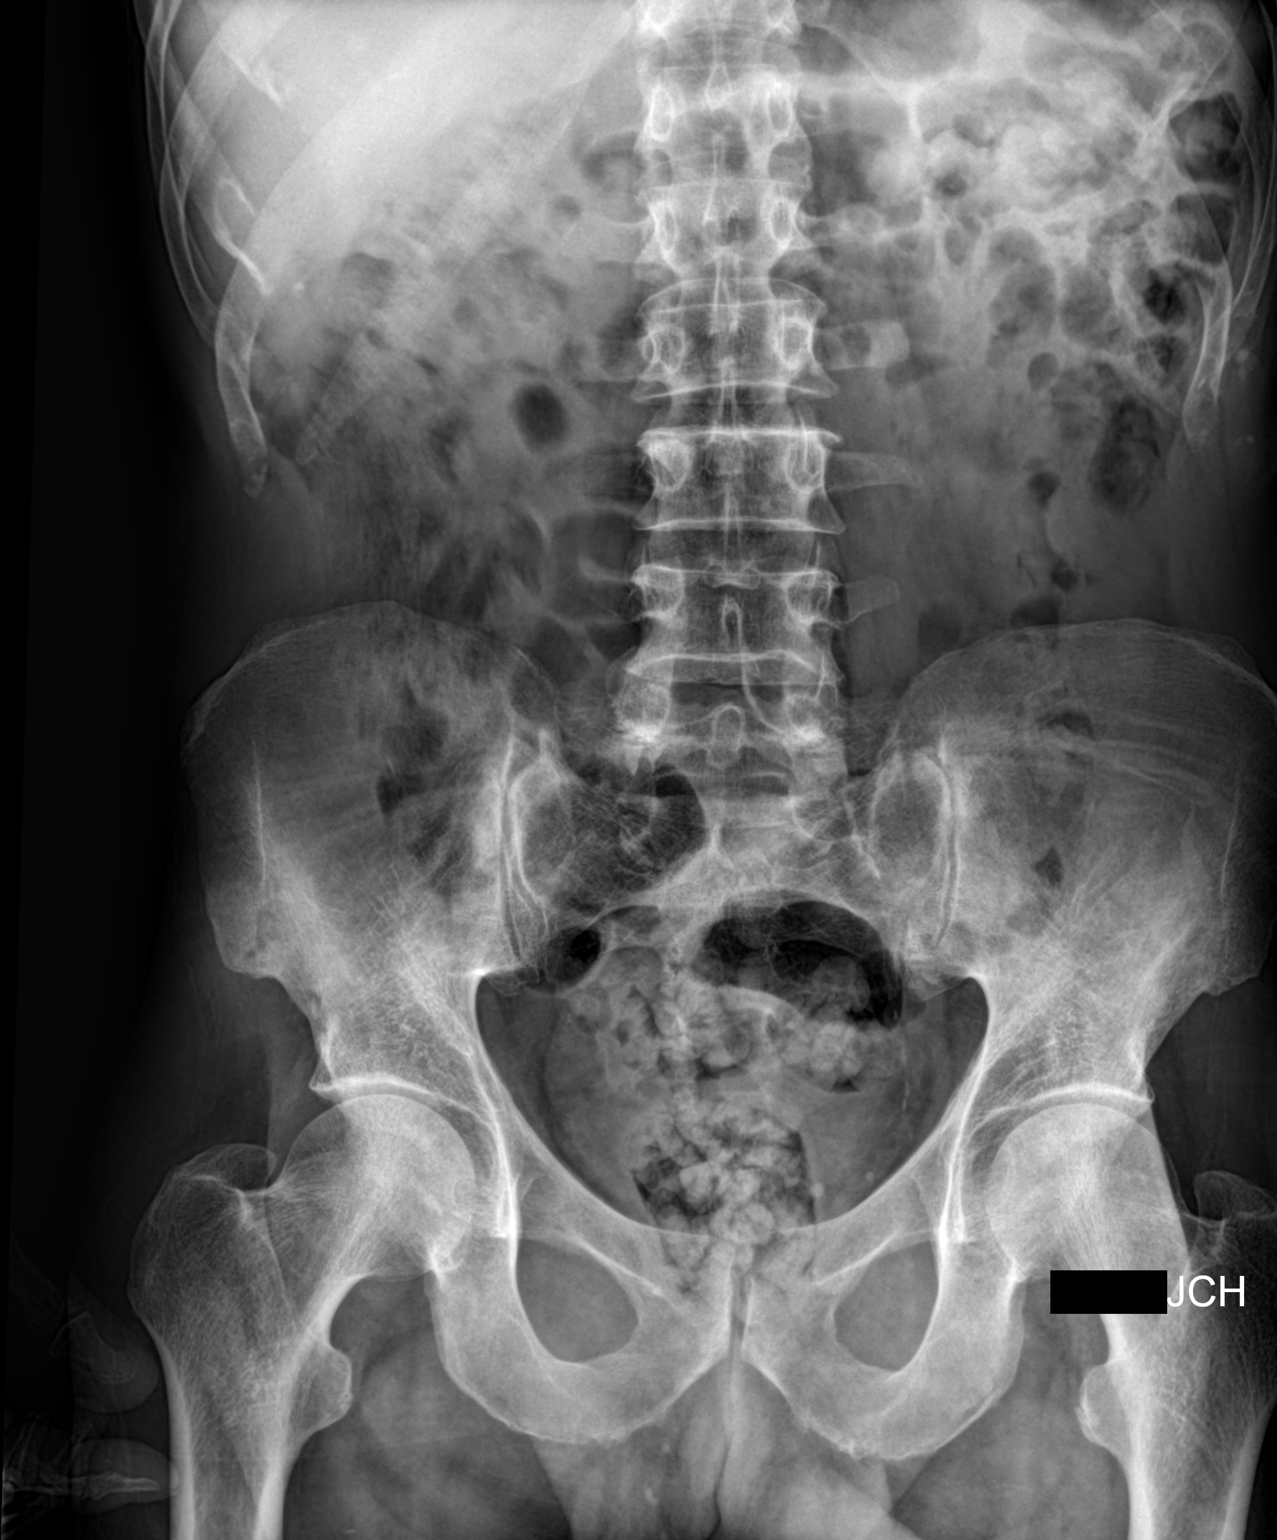

[2 of 2 positions shown; findings below may reference images not displayed]

FINDINGS: Nonobstructed bowel gas pattern.  Intermittent retained
stool the colon.  Mild motion artifact.  Grossly negative lung
bases.  No definite pneumoperitoneum. No acute osseous abnormality
identified.
IMPRESSION: Nonobstructed bowel gas pattern.

## 2013-08-31 ENCOUNTER — Encounter (HOSPITAL_COMMUNITY): Payer: Self-pay | Admitting: Psychiatry

## 2013-08-31 ENCOUNTER — Ambulatory Visit (INDEPENDENT_AMBULATORY_CARE_PROVIDER_SITE_OTHER): Payer: Self-pay | Admitting: Psychiatry

## 2013-08-31 VITALS — BP 114/78 | HR 70 | Ht 68.5 in | Wt 165.0 lb

## 2013-08-31 DIAGNOSIS — F329 Major depressive disorder, single episode, unspecified: Secondary | ICD-10-CM

## 2013-08-31 DIAGNOSIS — F09 Unspecified mental disorder due to known physiological condition: Secondary | ICD-10-CM

## 2013-08-31 MED ORDER — CITALOPRAM HYDROBROMIDE 10 MG PO TABS: ORAL_TABLET | ORAL | Status: DC
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
## 2013-10-04 ENCOUNTER — Encounter (HOSPITAL_COMMUNITY): Payer: Self-pay | Admitting: Psychiatry

## 2013-10-04 ENCOUNTER — Ambulatory Visit (INDEPENDENT_AMBULATORY_CARE_PROVIDER_SITE_OTHER): Payer: Self-pay | Admitting: Psychiatry

## 2013-10-04 ENCOUNTER — Ambulatory Visit (HOSPITAL_COMMUNITY): Payer: Self-pay | Admitting: Psychiatry

## 2013-10-04 VITALS — BP 104/79 | HR 75 | Ht 68.5 in | Wt 164.6 lb

## 2013-10-04 DIAGNOSIS — F09 Unspecified mental disorder due to known physiological condition: Secondary | ICD-10-CM

## 2013-10-04 DIAGNOSIS — F329 Major depressive disorder, single episode, unspecified: Secondary | ICD-10-CM

## 2013-10-04 MED ORDER — CITALOPRAM HYDROBROMIDE 20 MG PO TABS: ORAL_TABLET | ORAL | Status: DC

## 2013-10-04 NOTE — Progress Notes (Signed)
[facility] [address] Progress Note  Mark Ho [id number] 58 y.o.  10/04/2013 1:49 PM  Chief Complaint:  I like Celexa.    History of Present Illness:   Mark Ho came for his followup appointment.  Usually he comes with his sister but patient reported that his sister is unable to come today.  On his last visit we started him on Celexa because he could not afford Klonopin and Lexapro.  He is taking 20 mg Celexa.  He denies any side effects.  He still feels anxious and depressed because of financial difficulties.  He is unable to work.  He is applied for disability and has not heard anything from them.  Patient endorsed it was a difficult Christmas because he has not much money to give anything to her daughters.  Patient has 3 daughters.  His sister continues to support him.  He is sleeping better.  He denies any paranoia or any hallucination.  He is concerned about his finances.  Patient is unable to concentrate and go back to his work.  He is not drinking or using any illegal substances.  He denies any suicidal thoughts or homicidal thoughts.  He wants to continue Celexa 20 mg daily.    Suicidal Ideation: Yes Plan Formed: No Patient has means to carry out plan: No  Homicidal Ideation: No Plan Formed: No Patient has means to carry out plan: No  Review of Systems: Psychiatric: Agitation: No Hallucination: No Depressed Mood: Yes Insomnia: Yes Hypersomnia: No Altered Concentration: Yes Feels Worthless: Yes Grandiose Ideas: No Belief In Special Powers: No New/Increased Substance Abuse: No Compulsions: No  Neurologic: Headache: Yes Seizure: No Paresthesias: Yes numbness in his hand. Review of Systems  Psychiatric/Behavioral:       Anxiety    Past Psychiatric History;  Patient and his family denies any previous history of psychiatric inpatient treatment or any suicidal attempt.  There has been no history of psychosis paranoia or any hallucination.  He was noticed more  depressed since his mother died.  Patient lost his father and brother one month apart.  He was very close to his mother.  Medical History;  Patient has no active medical problems.  His primary care physician is Dr. Clarene Duke in [place] however he has not seen because of financial difficulties.  Outpatient Encounter Prescriptions as of 10/04/2013  Medication Sig  . citalopram (CELEXA) 20 MG tablet Take 1 t tab daily  . [DISCONTINUED] citalopram (CELEXA) 10 MG tablet Take 1 t tab daily for 1 week and than 2 tab daily  . [DISCONTINUED] citalopram (CELEXA) 20 MG tablet Take 1 t tab daily    No results found for this or any previous visit (from the past 72 hour(s)).  Past Psychiatric History/Hospitalization(s): Anxiety: Yes Bipolar Disorder: No Depression: Yes Mania: No Psychosis: No Schizophrenia: No Personality Disorder: No Hospitalization for psychiatric illness: No History of Electroconvulsive Shock Therapy: No Prior Suicide Attempts: No  Physical Exam: Constitutional:  BP 104/79  Pulse 75  Ht 5' 8.5" (1.74 m)  Wt 164 lb 9.6 oz (74.662 kg)  BMI 24.66 kg/m2  Musculoskeletal: Strength & Muscle Tone: decreased Gait & Station: normal  Mental Status Examination;  Patient is  fairly groomed and dressed.  He appears older than his stated age.  He is anxious and maintained fair eye contact.  His speech is  slow but coherent .  He is a poor historian and having difficulty remembering things.  His attention and concentration is fair.  He described  his mood is  anxious and his affect is constricted.  He  denies any active or passive suicidal thoughts or homicidal thoughts.  He denies any auditory or visual hallucination.  He has some thought blocking and poverty of thought content.  There were no delusions present at this time.  He has no tremors or shakes.    His fund of knowledge is below average.  He is alert and oriented x3.  His insight judgment and impulse control is okay.     Medical  Decision Making (Choose Three): Established Problem, Stable/Improving (1), Review of Last Therapy Session (1) and Review of Medication Regimen & Side Effects (2)  Assessment: Axis I: Maj. depressive disorder, cognitive disorder NOS  Axis II: Deferred  Axis III: Memory impairment, numbness in his arm  Axis IV: Moderate  Axis V: 50-55   Plan:  the patient is able to afford Celexa 20 mg daily.  I will continue this medication.  Patient does not have any side effects.  Patient had applied for disability in reading for the response.  Recommend to call us back if he has any question or any concern.  Followup in 3 months.  New prescription of Celexa 20 mg for 90 days is given.  Phelicia Dantes T., MD 10/04/2013

## 2013-12-12 ENCOUNTER — Emergency Department (HOSPITAL_COMMUNITY): Payer: Self-pay

## 2013-12-12 ENCOUNTER — Emergency Department (HOSPITAL_COMMUNITY)
Admission: EM | Admit: 2013-12-12 | Discharge: 2013-12-13 | Disposition: A | Discharge status: Home / Self Care | Payer: Self-pay | Attending: Emergency Medicine | Admitting: Emergency Medicine

## 2013-12-12 ENCOUNTER — Encounter (HOSPITAL_COMMUNITY): Payer: Self-pay | Admitting: Emergency Medicine

## 2013-12-12 DIAGNOSIS — Z87891 Personal history of nicotine dependence: Secondary | ICD-10-CM | POA: Insufficient documentation

## 2013-12-12 DIAGNOSIS — M79609 Pain in unspecified limb: Secondary | ICD-10-CM | POA: Insufficient documentation

## 2013-12-12 DIAGNOSIS — F3289 Other specified depressive episodes: Secondary | ICD-10-CM | POA: Insufficient documentation

## 2013-12-12 DIAGNOSIS — I10 Essential (primary) hypertension: Secondary | ICD-10-CM | POA: Insufficient documentation

## 2013-12-12 DIAGNOSIS — Z79899 Other long term (current) drug therapy: Secondary | ICD-10-CM | POA: Insufficient documentation

## 2013-12-12 DIAGNOSIS — Z8719 Personal history of other diseases of the digestive system: Secondary | ICD-10-CM | POA: Insufficient documentation

## 2013-12-12 DIAGNOSIS — F329 Major depressive disorder, single episode, unspecified: Secondary | ICD-10-CM | POA: Insufficient documentation

## 2013-12-12 DIAGNOSIS — R0789 Other chest pain: Secondary | ICD-10-CM | POA: Insufficient documentation

## 2013-12-12 DIAGNOSIS — R1110 Vomiting, unspecified: Secondary | ICD-10-CM | POA: Insufficient documentation

## 2013-12-12 LAB — CBC
HEMATOCRIT: 41.7 % (ref 39.0–52.0)
Hemoglobin: 14.6 g/dL (ref 13.0–17.0)
MCH: 31.8 pg (ref 26.0–34.0)
MCHC: 35 g/dL (ref 30.0–36.0)
MCV: 90.8 fL (ref 78.0–100.0)
PLATELETS: 208 10*3/uL (ref 150–400)
RBC: 4.59 MIL/uL (ref 4.22–5.81)
RDW: 12.8 % (ref 11.5–15.5)
WBC: 7.8 10*3/uL (ref 4.0–10.5)

## 2013-12-12 LAB — BASIC METABOLIC PANEL
BUN: 9 mg/dL (ref 6–23)
CALCIUM: 9.7 mg/dL (ref 8.4–10.5)
CHLORIDE: 101 meq/L (ref 96–112)
CO2: 27 mEq/L (ref 19–32)
CREATININE: 0.81 mg/dL (ref 0.50–1.35)
GFR calc non Af Amer: >90 mL/min (ref >90)
Glucose, Bld: 106 mg/dL — ABNORMAL HIGH (ref 70–99)
Potassium: 4.4 mEq/L (ref 3.7–5.3)
Sodium: 139 mEq/L (ref 137–147)

## 2013-12-12 LAB — I-STAT TROPONIN, ED: TROPONIN I, POC: 0 ng/mL (ref 0.00–0.08)

## 2013-12-12 MED ORDER — GI COCKTAIL ~~LOC~~
30.0000 mL | Freq: Once | ORAL | Status: AC
  Administered 2013-12-12: 30 mL via ORAL
  Filled 2013-12-12: qty 30

## 2013-12-12 MED ORDER — ONDANSETRON 8 MG PO TBDP
8.0000 mg | ORAL_TABLET | Freq: Once | ORAL | Status: AC
  Administered 2013-12-12: 8 mg via ORAL
  Filled 2013-12-12: qty 2

## 2013-12-12 NOTE — ED Notes (Signed)
Pt presents with c/o mid chest pain and left arm pain. Pt says he has been vomiting since this morning. Per family, pt has thrown up both times after he has eaten today.

## 2013-12-12 NOTE — ED Notes (Signed)
MD at bedside. 

## 2013-12-13 LAB — HEPATIC FUNCTION PANEL
ALBUMIN: 4 g/dL (ref 3.5–5.2)
ALT: 35 U/L (ref 0–53)
AST: 23 U/L (ref 0–37)
Alkaline Phosphatase: 74 U/L (ref 39–117)
Bilirubin, Direct: <0.2 mg/dL (ref 0.0–0.3)
Total Bilirubin: 0.4 mg/dL (ref 0.3–1.2)
Total Protein: 7.1 g/dL (ref 6.0–8.3)

## 2013-12-13 LAB — LIPASE, BLOOD: Lipase: 39 U/L (ref 11–59)

## 2013-12-13 MED ORDER — OMEPRAZOLE 20 MG PO CPDR: 20.0000 mg | DELAYED_RELEASE_CAPSULE | Freq: Every day | ORAL | Status: AC

## 2013-12-13 NOTE — Discharge Instructions (Signed)
Please followup with the doctors listed above.  It is important for you to get further testing for your symptoms.  Began taking Prilosec.  This can be bought over-the-counter.  Return to the emergency room for worsening condition or new concerning symptoms.   Chest Pain Observation It is often hard to give a specific diagnosis for the cause of chest pain. Among other possibilities your symptoms might be caused by inadequate oxygen delivery to your heart (angina). Angina that is not treated or evaluated can lead to a heart attack (myocardial infarction) or death. Blood tests, electrocardiograms, and X-rays may have been done to help determine a possible cause of your chest pain. After evaluation and observation, your health care provider has determined that it is unlikely your pain was caused by an unstable condition that requires hospitalization. However, a full evaluation of your pain may need to be completed, with additional diagnostic testing as directed. It is very important to keep your follow-up appointments. Not keeping your follow-up appointments could result in permanent heart damage, disability, or death. If there is any problem keeping your follow-up appointments, you must call your health care provider. HOME CARE INSTRUCTIONS  Due to the slight chance that your pain could be angina, it is important to follow your health care provider's treatment plan and also maintain a healthy lifestyle:  Maintain or work toward achieving a healthy weight.  Stay physically active and exercise regularly.  Decrease your salt intake.  Eat a balanced, healthy diet. Talk to a dietician to learn about heart healthy foods.  Increase your fiber intake by including whole grains, vegetables, fruits, and nuts in your diet.  Avoid situations that cause stress, anger, or depression.  Take medicines as advised by your health care provider. Report any side effects to your health care provider. Do not stop  medicines or adjust the dosages on your own.  Quit smoking. Do not use nicotine patches or gum until you check with your health care provider.  Keep your blood pressure, blood sugar, and cholesterol levels within normal limits.  Limit alcohol intake to no more than 1 drink per day for women that are not pregnant and 2 drinks per day for men.  Do not abuse drugs. SEEK IMMEDIATE MEDICAL CARE IF: You have severe chest pain or pressure which may include symptoms such as:  You feel pain or pressure in you arms, neck, jaw, or back.  You have severe back or abdominal pain, feel sick to your stomach (nauseous), or throw up (vomit).  You are sweating profusely.  You are having a fast or irregular heartbeat.  You feel short of breath while at rest.  You notice increasing shortness of breath during rest, sleep, or with activity.  You have chest pain that does not get better after rest or after taking your usual medicine.  You wake from sleep with chest pain.  You are unable to sleep because you cannot breathe.  You develop a frequent cough or you are coughing up blood.  You feel dizzy, faint, or experience extreme fatigue.  You develop severe weakness, dizziness, fainting, or chills. Any of these symptoms may represent a serious problem that is an emergency. Do not wait to see if the symptoms will go away. Call your local emergency services (911 in the [place] Do not drive yourself to the hospital. MAKE SURE YOU:  Understand these instructions.  Will watch your condition.  Will get help right away if you are not doing well or get  worse. Document Released: 10/10/2010 Document Revised: 05/10/2013 Document Reviewed: 03/09/2013 [facility] Patient Information 2014 [place], [place].   Nausea and Vomiting Nausea is a sick feeling that often comes before throwing up (vomiting). Vomiting is a reflex where stomach contents come out of your mouth. Vomiting can cause severe loss of body fluids  (dehydration). Children and elderly adults can become dehydrated quickly, especially if they also have diarrhea. Nausea and vomiting are symptoms of a condition or disease. It is important to find the cause of your symptoms. CAUSES   Direct irritation of the stomach lining. This irritation can result from increased acid production (gastroesophageal reflux disease), infection, food poisoning, taking certain medicines (such as nonsteroidal anti-inflammatory drugs), alcohol use, or tobacco use.  Signals from the brain.These signals could be caused by a headache, heat exposure, an inner ear disturbance, increased pressure in the brain from injury, infection, a tumor, or a concussion, pain, emotional stimulus, or metabolic problems.  An obstruction in the gastrointestinal tract (bowel obstruction).  Illnesses such as diabetes, hepatitis, gallbladder problems, appendicitis, kidney problems, cancer, sepsis, atypical symptoms of a heart attack, or eating disorders.  Medical treatments such as chemotherapy and radiation.  Receiving medicine that makes you sleep (general anesthetic) during surgery. DIAGNOSIS Your caregiver may ask for tests to be done if the problems do not improve after a few days. Tests may also be done if symptoms are severe or if the reason for the nausea and vomiting is not clear. Tests may include:  Urine tests.  Blood tests.  Stool tests.  Cultures (to look for evidence of infection).  X-rays or other imaging studies. Test results can help your caregiver make decisions about treatment or the need for additional tests. TREATMENT You need to stay well hydrated. Drink frequently but in small amounts.You may wish to drink water, sports drinks, clear broth, or eat frozen ice pops or gelatin dessert to help stay hydrated.When you eat, eating slowly may help prevent nausea.There are also some antinausea medicines that may help prevent nausea. HOME CARE INSTRUCTIONS   Take  all medicine as directed by your caregiver.  If you do not have an appetite, do not force yourself to eat. However, you must continue to drink fluids.  If you have an appetite, eat a normal diet unless your caregiver tells you differently.  Eat a variety of complex carbohydrates (rice, wheat, potatoes, bread), lean meats, yogurt, fruits, and vegetables.  Avoid high-fat foods because they are more difficult to digest.  Drink enough water and fluids to keep your urine clear or pale yellow.  If you are dehydrated, ask your caregiver for specific rehydration instructions. Signs of dehydration may include:  Severe thirst.  Dry lips and mouth.  Dizziness.  Dark urine.  Decreasing urine frequency and amount.  Confusion.  Rapid breathing or pulse. SEEK IMMEDIATE MEDICAL CARE IF:   You have blood or brown flecks (like coffee grounds) in your vomit.  You have black or bloody stools.  You have a severe headache or stiff neck.  You are confused.  You have severe abdominal pain.  You have chest pain or trouble breathing.  You do not urinate at least once every 8 hours.  You develop cold or clammy skin.  You continue to vomit for longer than 24 to 48 hours.  You have a fever. MAKE SURE YOU:   Understand these instructions.  Will watch your condition.  Will get help right away if you are not doing well or get worse. Document  Released: 09/07/2005 Document Revised: 11/30/2011 Document Reviewed: 02/04/2011 [facility] Patient Information 2014 [place], [place].

## 2013-12-13 NOTE — ED Provider Notes (Signed)
CSN: [id number]     Arrival date & time 12/12/13  2206 History   First MD Initiated Contact with Patient 12/12/13 2304     Chief Complaint  Patient presents with  . Chest Pain  . Vomiting     (Consider location/radiation/quality/duration/timing/severity/associated sxs/prior Treatment) HPI 58 year old male presents to emergency department from home with complaint of chest pain, left arm pain and vomiting.  Patient and daughter report that he had vomiting after dinner tonight and worsening of his central chest pain with radiation into his left arm.  Patient reports he has had daily chest and arm pain ongoing for last month to 2 months.  He reports that he vomits at least once a day, usually after eating.  He has history of esophageal stricture requiring stretching in the past, but denies any sensation of food being hung up.  He denies history of hypertension, hyperlipidemia.  He is on no medications.  He reports that he occasionally takes his sister's acid medication, which does seem to help.  Patient smokes less than a pack a day Past Medical History  Diagnosis Date  . Esophageal stricture   . Hiatal hernia   . HTN (hypertension) 07/13/2012  . Depression   . GERD (gastroesophageal reflux disease)    Past Surgical History  Procedure Laterality Date  . Esphogeaous stretched     Family History  Problem Relation Age of Onset  . Anxiety disorder Mother   . Anxiety disorder Sister    History  Substance Use Topics  . Smoking status: Former Smoker -- 0.50 packs/day for 40 years    Types: Cigarettes    Quit date: 04/26/2012  . Smokeless tobacco: Not on file  . Alcohol Use: No    Review of Systems   See History of Present Illness; otherwise all other systems are reviewed and negative  Allergies  Review of patient's allergies indicates no known allergies.  Home Medications   Current Outpatient Rx  Name  Route  Sig  Dispense  Refill  . escitalopram (LEXAPRO) 5 MG tablet   Oral   Take 5 mg by mouth daily.          BP 121/85  Pulse 60  Temp(Src) 98.5 F (36.9 C) (Oral)  Resp 16  Ht 5\' 8"  (1.727 m)  Wt 160 lb (72.576 kg)  BMI 24.33 kg/m2  SpO2 100% Physical Exam  Nursing note and vitals reviewed. Constitutional: He is oriented to person, place, and time. He appears well-developed and well-nourished. No distress.  HENT:  Head: Normocephalic and atraumatic.  Nose: Nose normal.  Mouth/Throat: Oropharynx is clear and moist.  Eyes: Conjunctivae and EOM are normal. Pupils are equal, round, and reactive to light.  Neck: Normal range of motion. Neck supple. No JVD present. No tracheal deviation present. No thyromegaly present.  Cardiovascular: Normal rate, regular rhythm, normal heart sounds and intact distal pulses.  Exam reveals no gallop and no friction rub.   No murmur heard. Pulmonary/Chest: Effort normal and breath sounds normal. No stridor. No respiratory distress. He has no wheezes. He has no rales. He exhibits no tenderness.  Abdominal: Soft. Bowel sounds are normal. He exhibits no distension and no mass. There is no tenderness. There is no rebound and no guarding.  Musculoskeletal: Normal range of motion. He exhibits no edema and no tenderness.  Lymphadenopathy:    He has no cervical adenopathy.  Neurological: He is alert and oriented to person, place, and time. He exhibits normal muscle tone. Coordination normal.  Skin: Skin is warm and dry. No rash noted. No erythema. No pallor.  Psychiatric: He has a normal mood and affect. His behavior is normal. Judgment and thought content normal.    ED Course  Procedures (including critical care time) Labs Review Labs Reviewed  BASIC METABOLIC PANEL - Abnormal; Notable for the following:    Glucose, Bld 106 (*)    All other components within normal limits  CBC  HEPATIC FUNCTION PANEL  LIPASE, BLOOD  I-STAT TROPOININ, ED   Imaging Review Dg Chest 2 View  12/13/2013   CLINICAL DATA:  Chest pain.  EXAM:  CHEST  2 VIEW  COMPARISON:  DG CHEST 2 VIEW dated 07/08/2012  FINDINGS: Cardiomediastinal silhouette is unremarkable. The lungs are clear without pleural effusions or focal consolidations. Minimal biapical pleural thickening. Trachea projects midline and there is no pneumothorax. Soft tissue planes and included osseous structures are non-suspicious. Multiple EKG lines overlie the patient and may obscure subtle underlying pathology.  IMPRESSION: No acute cardiopulmonary process.   Electronically Signed   By: Awilda Metro   On: 12/13/2013 00:06   Dg Abd 1 View  12/13/2013   CLINICAL DATA:  Chest pain, abdominal pain and vomiting.  EXAM: ABDOMEN - 1 VIEW  COMPARISON:  DG ABDOMEN 1V dated 07/14/2012  FINDINGS: Bowel gas pattern is nondilated and nonobstructive. No intra-abdominal mass effect, pathologic calcifications or free air. Soft tissue planes and included osseous structures are nonsuspicious. Phleboliths in the pelvis. Mild vascular calcifications.  IMPRESSION: Nonobstructive bowel gas pattern.   Electronically Signed   By: Awilda Metro   On: 12/13/2013 00:07     EKG Interpretation None      Date: 12/13/2013  Rate: 71  Rhythm: normal sinus rhythm  QRS Axis: normal  Intervals: normal  ST/T Wave abnormalities: normal  Conduction Disutrbances:none  Narrative Interpretation:   Old EKG Reviewed: none available    MDM   Final diagnoses:  Atypical chest pain  Vomiting    58 year old male with what sounds to be chronic.  Chest pain, arm pain and vomiting.  Patient has had constant chest pain since onset, at 5 PM.  Troponin, here is negative.  EKG without ischemia.  Chest x-ray, and abdominal x-ray are normal.  Patient needs outpatient evaluation by gastroenterology and cardiology, but I do not feel he requires admission at this time.  We'll start him on a OTC PPI to help with acid reduction.  As I feel this may be a bit portion of the symptoms.  We'll refer him to gastroenterology  and followup cardiology.    Olivia Mackie, MD 12/13/13 [phone]

## 2014-01-02 ENCOUNTER — Ambulatory Visit (INDEPENDENT_AMBULATORY_CARE_PROVIDER_SITE_OTHER): Payer: Self-pay | Admitting: Psychiatry

## 2014-01-02 ENCOUNTER — Telehealth (HOSPITAL_COMMUNITY): Payer: Self-pay

## 2014-01-02 ENCOUNTER — Encounter (HOSPITAL_COMMUNITY): Payer: Self-pay | Admitting: Psychiatry

## 2014-01-02 VITALS — BP 110/70 | HR 78 | Ht 68.5 in | Wt 165.0 lb

## 2014-01-02 DIAGNOSIS — F09 Unspecified mental disorder due to known physiological condition: Secondary | ICD-10-CM

## 2014-01-02 DIAGNOSIS — F329 Major depressive disorder, single episode, unspecified: Secondary | ICD-10-CM

## 2014-01-02 MED ORDER — ESCITALOPRAM OXALATE 20 MG PO TABS: 20.0000 mg | ORAL_TABLET | Freq: Every day | ORAL | Status: AC

## 2014-01-02 NOTE — Progress Notes (Signed)
[id number]  Mark Ho [id number] 58 y.o.  01/02/2014 2:20 PM  Chief Complaint:  I am very concerned because my disabilities denied.      History of Present Illness:   Mark Ho came for his followup appointment.  Patient continues to have chronic depression but he is taking Celexa 20 mg daily.  He still has poor sleep sometime but denies any suicidal thoughts or homicidal thoughts.  Patient is very upset because his disability again tonight.  I talked with his sister on the phone as patient insists to talk to her .  Her sister is also concerned about his disability.  I agree patient is unable to work.  He has difficulty organizing his thoughts, attention and concentration.  Patient is trying to get help from lawyer .  Patient denies any side effects of Celexa.  When I ask about increasing the doses of medication , the patient refused because he cannot afford medication.  In the past he had a good response to Lexapro and Klonopin but he stopped taking it because of expense.  Patient denies any agitation, anger or any mood swing.  He has memory impairment and also he has difficulty organizing his thoughts.   He is not drinking or using any illegal substances.  He denies any suicidal thoughts or homicidal thoughts.  He wants to continue Celexa 20 mg daily.    Suicidal Ideation: No Plan Formed: No Patient has means to carry out plan: No  Homicidal Ideation: No Plan Formed: No Patient has means to carry out plan: No  Review of Systems: Psychiatric: Agitation: No Hallucination: No Depressed Mood: Yes Insomnia: Yes Hypersomnia: No Altered Concentration: Yes Feels Worthless: Yes Grandiose Ideas: No Belief In Special Powers: No New/Increased Substance Abuse: No Compulsions: No  Neurologic: Headache: Yes Seizure: No Paresthesias: Yes numbness in his hand. Review of Systems  Psychiatric/Behavioral:       Anxiety    Past Psychiatric History;  Patient  and his family denies any previous history of psychiatric inpatient treatment or any suicidal attempt.  There has been no history of psychosis paranoia or any hallucination.  He was noticed more depressed since his mother died.  Patient lost his father and brother one month apart.  He was very close to his mother.  Medical History;  Patient has no active medical problems.  His primary care physician is Dr. Clarene [place] in [place] however he has not seen because of financial difficulties.  Outpatient Encounter Prescriptions as of 01/02/2014  Medication Sig  . escitalopram (LEXAPRO) 20 MG tablet Take 1 tablet (20 mg total) by mouth daily.  Marland Kitchen. omeprazole (PRILOSEC) 20 MG capsule Take 1 capsule (20 mg total) by mouth daily.  . [DISCONTINUED] escitalopram (LEXAPRO) 5 MG tablet Take 5 mg by mouth daily.    No results found for this or any previous visit (from the past 72 hour(s)).  Past Psychiatric History/Hospitalization(s): Anxiety: Yes Bipolar Disorder: No Depression: Yes Mania: No Psychosis: No Schizophrenia: No Personality Disorder: No Hospitalization for psychiatric illness: No History of Electroconvulsive Shock Therapy: No Prior Suicide Attempts: No  Physical Exam: Constitutional:  BP 110/70  Pulse 78  Ht 5' 8.5" (1.74 m)  Wt 165 lb (74.844 kg)  BMI 24.72 kg/m2  Musculoskeletal: Strength & Muscle Tone: decreased Gait & Station: normal  Mental Status Examination;  Patient is fairly groomed and dressed.  He appears older than his stated age.  He is anxious and maintained fair eye contact.  His speech  is  slow but coherent .  He is a poor historian and having difficulty remembering things.  His attention and concentration is fair.  He described his mood is  depressed and anxious.  His affect is constricted.  He  denies any active or passive suicidal thoughts or homicidal thoughts.  He denies any auditory or visual hallucination.  He has thought blocking and poverty of thought content.   There were no delusions present at this time.  He has no tremors or shakes.    His fund of knowledge is below average.  He is alert and oriented x3.  His insight judgment and impulse control is okay.     Established Problem, Stable/Improving (1), Review of Last Therapy Session (1) and Review of Medication Regimen & Side Effects (2)  Assessment: Axis I: Maj. depressive disorder, cognitive disorder NOS  Axis II: Deferred  Axis III: Memory impairment, numbness in his arm  Axis IV: Moderate  Axis V: 50-55   Plan: I recommended to have his lawyer contact us so we can send his records to them .  I do believe patient is unable to work at this time because of poor attention, concentration and memory impairment.  He has difficulty organizing his thoughts.  For now I will continue Celexa 20 mg daily this patient does not afford any of the medication and higher doses of medication.  Recommend to call us back if he is any question or any concern.  I will see him in 3 months.  Joud Pettinato T., MD 01/02/2014

## 2014-01-03 ENCOUNTER — Telehealth (HOSPITAL_COMMUNITY): Payer: Self-pay | Admitting: *Deleted

## 2014-01-03 NOTE — Telephone Encounter (Signed)
[facility] left VM:Pt came to store with stack of papers from office visit stating they were prescriptions.No prescription in papers.No new RX received by escript.This pharmacy has filled RX for pt only twice in last year.Please call ifpt was to have  RX sent to them. Contacted pharmacy: ADvised them that pt medication sent to [facility] in [place]. Left message for patient at home number that RX sent to [facility] in [place]

## 2014-04-03 ENCOUNTER — Ambulatory Visit (HOSPITAL_COMMUNITY): Payer: Self-pay | Admitting: Psychiatry
